# Patient Record
Sex: Female | Born: 1965 | Hispanic: Yes | Smoking: Never smoker
Health system: Southern US, Community
[De-identification: ages and names within clinical notes are randomized; demographics above are authoritative.]

## PROBLEM LIST (undated history)

## (undated) DIAGNOSIS — E041 Nontoxic single thyroid nodule: Secondary | ICD-10-CM

## (undated) HISTORY — PX: UPPER GASTROINTESTINAL ENDOSCOPY: SHX188

## (undated) HISTORY — DX: Nontoxic single thyroid nodule: E04.1

---

## 1992-08-13 HISTORY — PX: OVARIAN CYST REMOVAL: SHX89

## 2006-06-04 ENCOUNTER — Other Ambulatory Visit: Admission: RE | Admit: 2006-06-04 | Discharge: 2006-06-04 | Payer: Self-pay | Admitting: Gynecology

## 2007-03-24 ENCOUNTER — Encounter: Admission: RE | Admit: 2007-03-24 | Discharge: 2007-03-24 | Payer: Self-pay | Admitting: Gynecology

## 2007-04-07 ENCOUNTER — Encounter: Admission: RE | Admit: 2007-04-07 | Discharge: 2007-04-07 | Payer: Self-pay | Admitting: Gynecology

## 2007-06-23 ENCOUNTER — Other Ambulatory Visit: Admission: RE | Admit: 2007-06-23 | Discharge: 2007-06-23 | Payer: Self-pay | Admitting: Gynecology

## 2007-07-22 ENCOUNTER — Encounter (INDEPENDENT_AMBULATORY_CARE_PROVIDER_SITE_OTHER): Payer: Self-pay | Admitting: Interventional Radiology

## 2007-07-22 ENCOUNTER — Encounter: Admission: RE | Admit: 2007-07-22 | Discharge: 2007-07-22 | Payer: Self-pay | Admitting: Endocrinology

## 2007-07-22 ENCOUNTER — Other Ambulatory Visit: Admission: RE | Admit: 2007-07-22 | Discharge: 2007-07-22 | Payer: Self-pay | Admitting: Interventional Radiology

## 2009-01-20 ENCOUNTER — Encounter: Payer: Self-pay | Admitting: Gynecology

## 2009-01-20 ENCOUNTER — Other Ambulatory Visit: Admission: RE | Admit: 2009-01-20 | Discharge: 2009-01-20 | Payer: Self-pay | Admitting: Gynecology

## 2009-01-20 ENCOUNTER — Ambulatory Visit: Payer: Self-pay | Admitting: Gynecology

## 2009-04-12 ENCOUNTER — Ambulatory Visit: Payer: Self-pay | Admitting: Gynecology

## 2009-07-08 IMAGING — US US SOFT TISSUE HEAD/NECK
1 series · 13 of 25 positions shown · non-contrast
Comparison: NONE

CLINICAL DATA: Right thyroid nodule. 

THYROID ULTRASOUND

[Series 1: us thyroid · 0.06mm/px · 13 of 62 slices shown]
[im 1/62]
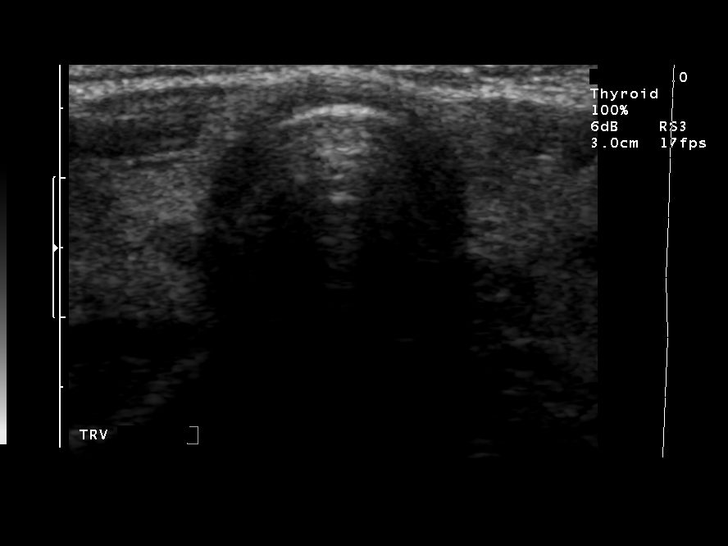
[im 6/62]
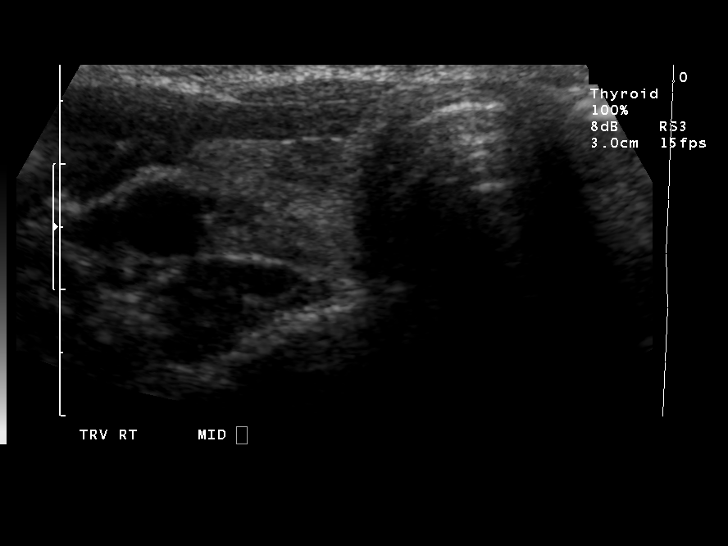
[im 11/62]
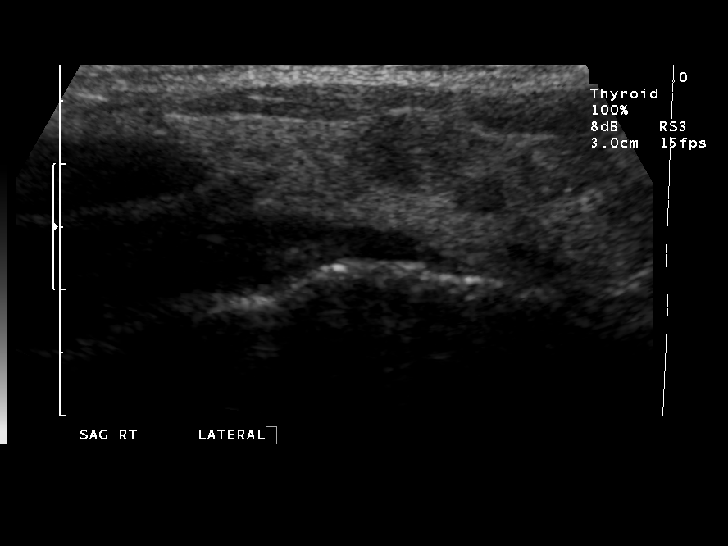
[im 16/62]
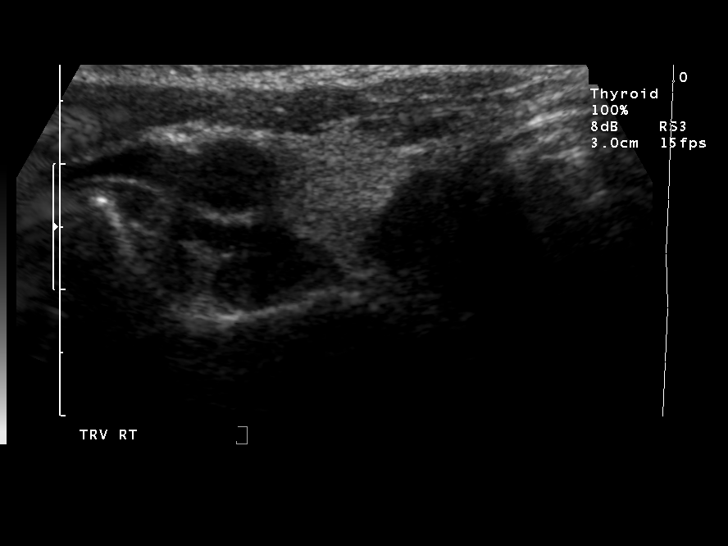
[im 21/62]
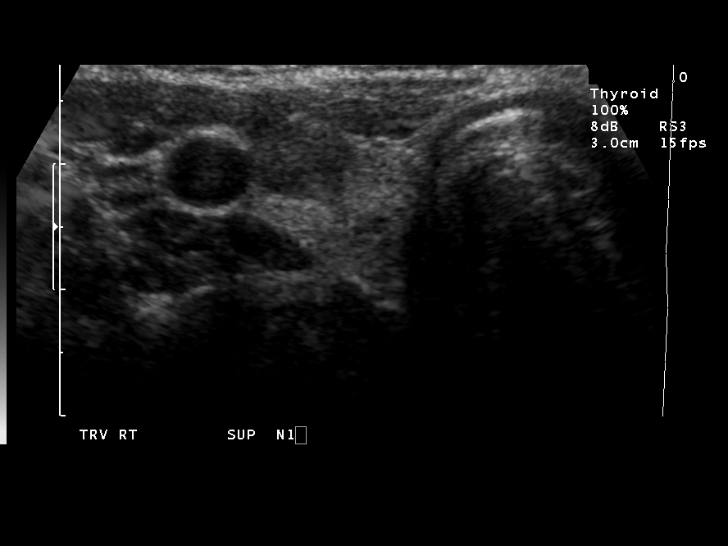
[im 26/62]
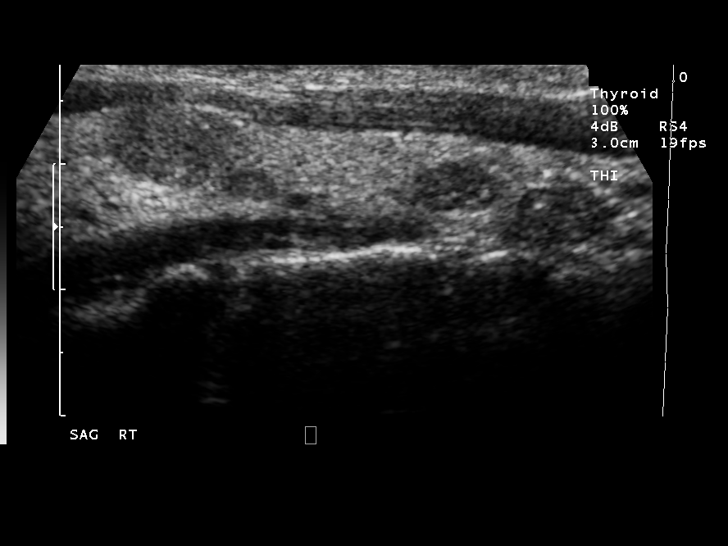
[im 31/62]
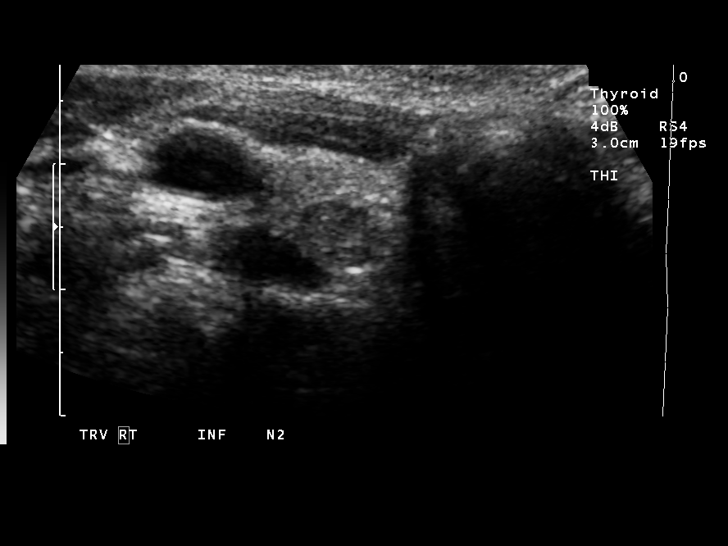
[im 36/62]
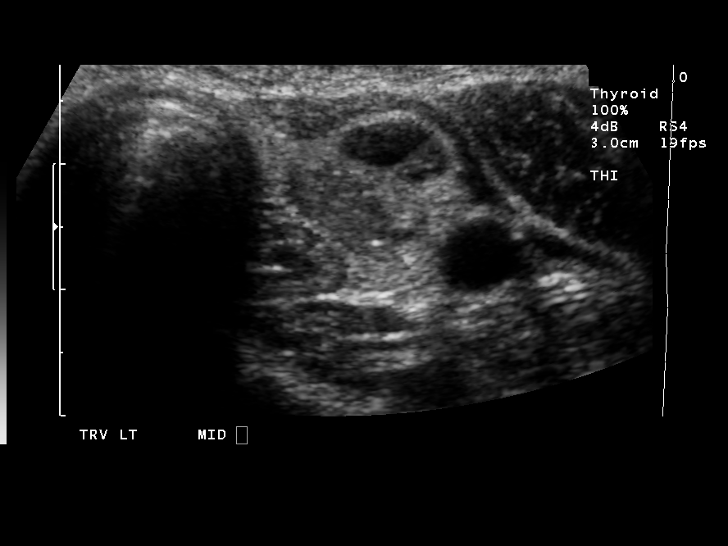
[im 41/62]
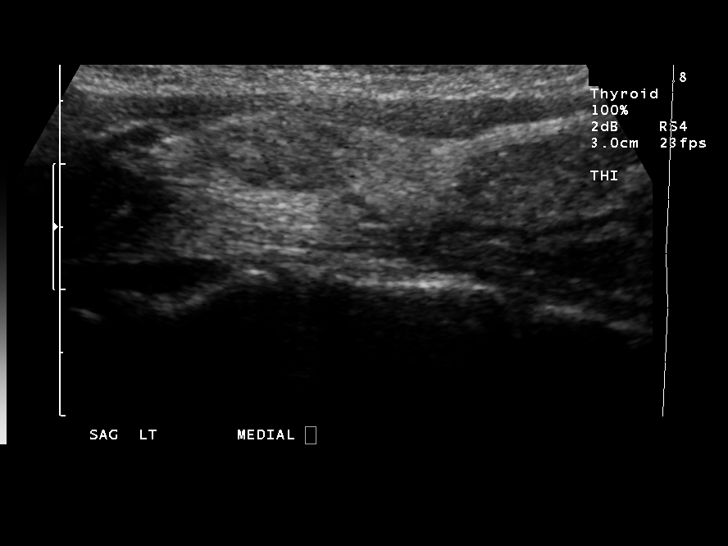
[im 46/62]
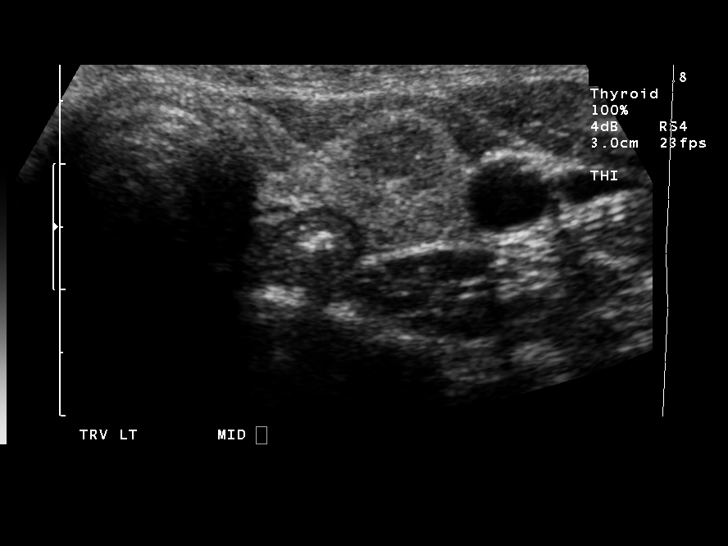
[im 51/62]
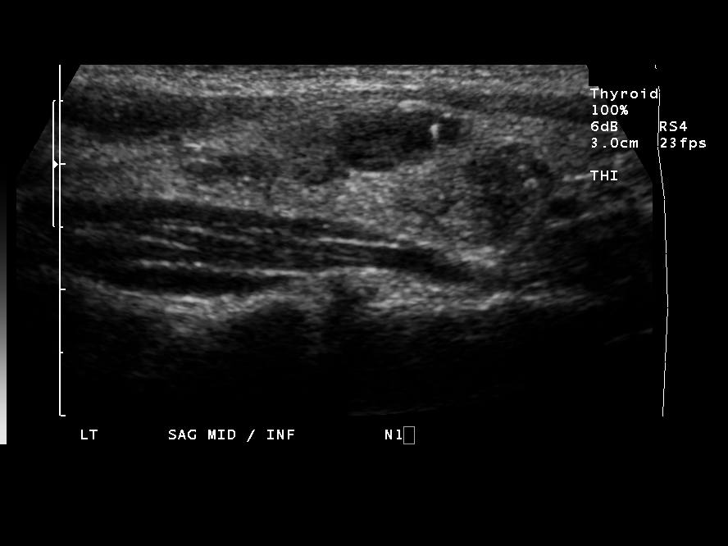
[im 56/62]
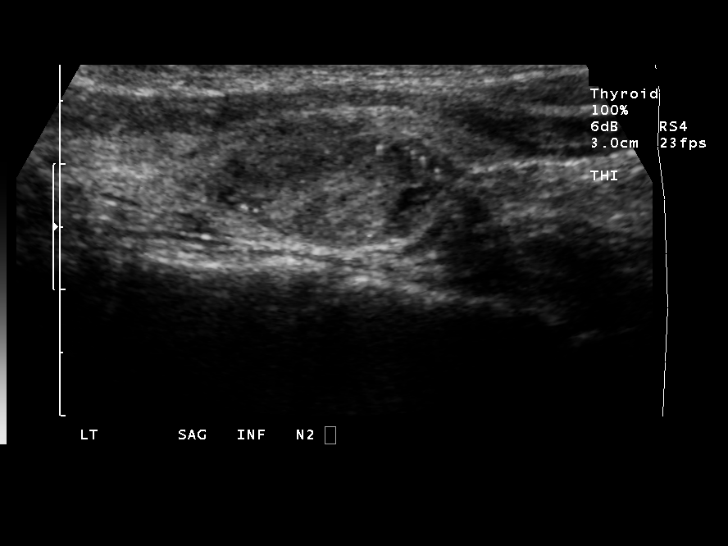
[im 62/62]
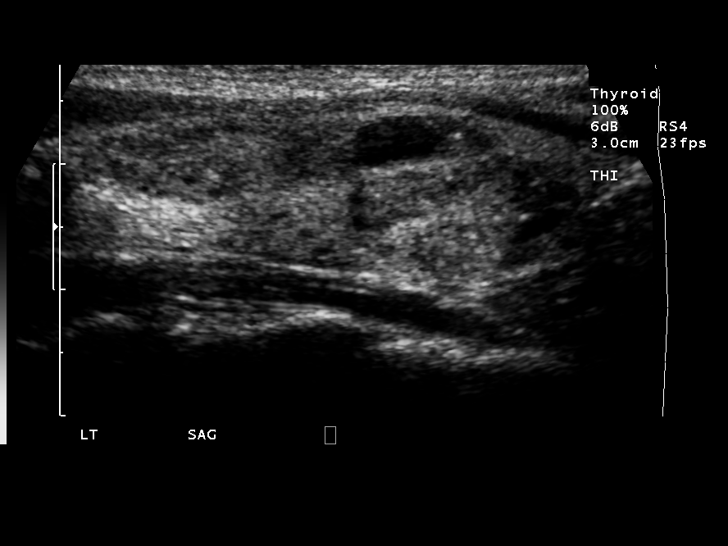

[13 of 25 positions shown; findings below may reference images not displayed]

FINDINGS RIGHT LOBE: 4.4 x 1.4 x 1.5 cm LEFT LOBE: 1.8 cm in the 
transverse plane ISTHMUS: 0.21 cm The thyroid gland appears normal 
for size. The left lobe was inadvertently not measured in the 
sagittal and AP dimensions. Examination of the right lobe 
demonstrates a heterogeneous solid nodule in the upper lobe 
measuring 1.2 x 0.7 x 0.8 cm. A similar appearing heterogeneous 
nodule is noted inferiorly measuring 0.9 x 0.6 x 0.7 cm. There 
appears to be an adjacent similar appearing nodule in the lower 
lobe which was not measured but appears slightly smaller. 
Examination of the left lobe demonstrates multiple nodules 
throughout the lobe. The largest is located inferiorly and is 
heterogeneous and solid measuring 2.1 x 1.0 x 1.1 cm. This 
contains punctate calcifications, which raises the incidence of 
malignancy. A hypoechoic solid nodule is located in the mid lobe 
measuring 1.0 x 0.5 x 0.6 cm. Smaller solid nodules are scattered 
throughout the lobe.
IMPRESSION: Bilateral thyroid nodules consistent with nodular 
thyroid disease. Dominant nodule in the left lobe, measuring
cm, contains punctate calcifications. This raises the risk that 
this may be a malignant nodule. I recommend fine needle aspiration 
biopsy of this nodule. I recommend ultrasound surveillance of the 
remaining nodules with follow-up study in 6 months. Ener Auhsoj Mondongazo 
Joshjax, M.D. electronically reviewed on 06/25/2007 Dict Date: 
06/25/2007  Tran Date: 06/25/2007 CAV  [REDACTED]

## 2009-09-13 ENCOUNTER — Ambulatory Visit: Payer: Self-pay | Admitting: Gynecology

## 2009-09-15 ENCOUNTER — Encounter: Admission: RE | Admit: 2009-09-15 | Discharge: 2009-09-15 | Payer: Self-pay | Admitting: Gynecology

## 2009-10-17 ENCOUNTER — Encounter: Admission: RE | Admit: 2009-10-17 | Discharge: 2009-10-17 | Payer: Self-pay | Admitting: Gynecology

## 2010-02-08 ENCOUNTER — Ambulatory Visit: Payer: Self-pay | Admitting: Gynecology

## 2010-02-08 ENCOUNTER — Other Ambulatory Visit
Admission: RE | Admit: 2010-02-08 | Discharge: 2010-02-08 | Payer: Self-pay | Source: Home / Self Care | Admitting: Gynecology

## 2010-09-03 ENCOUNTER — Encounter: Payer: Self-pay | Admitting: Gynecology

## 2011-02-15 ENCOUNTER — Encounter: Payer: Self-pay | Admitting: *Deleted

## 2013-06-19 ENCOUNTER — Ambulatory Visit: Payer: Self-pay

## 2013-06-19 VITALS — BP 138/78 | HR 56 | Resp 12 | Ht 61.0 in | Wt 125.0 lb

## 2013-06-19 DIAGNOSIS — B351 Tinea unguium: Secondary | ICD-10-CM

## 2013-06-19 DIAGNOSIS — M2010 Hallux valgus (acquired), unspecified foot: Secondary | ICD-10-CM

## 2013-06-19 NOTE — Patient Instructions (Signed)
Onychomycosis/Fungal Toenails  WHAT IS IT? An infection that lies within the keratin of your nail plate that is caused by a fungus.  WHY ME? Fungal infections affect all ages, sexes, races, and creeds.  There may be many factors that predispose you to a fungal infection such as age, coexisting medical conditions such as diabetes, or an autoimmune disease; stress, medications, fatigue, genetics, etc.  Bottom line: fungus thrives in a warm, moist environment and your shoes offer such a location.  IS IT CONTAGIOUS? Theoretically, yes.  You do not want to share shoes, nail clippers or files with someone who has fungal toenails.  Walking around barefoot in the same room or sleeping in the same bed is unlikely to transfer the organism.  It is important to realize, however, that fungus can spread easily from one nail to the next on the same foot.  HOW DO WE TREAT THIS?  There are several ways to treat this condition.  Treatment may depend on many factors such as age, medications, pregnancy, liver and kidney conditions, etc.  It is best to ask your doctor which options are available to you.  1. No treatment.   Unlike many other medical concerns, you can live with this condition.  However for many people this can be a painful condition and may lead to ingrown toenails or a bacterial infection.  It is recommended that you keep the nails cut short to help reduce the amount of fungal nail. 2. Topical treatment.  These range from herbal remedies to prescription strength nail lacquers.  About 40-50% effective, topicals require twice daily application for approximately 9 to 12 months or until an entirely new nail has grown out.  The most effective topicals are medical grade medications available through physicians offices. 3. Oral antifungal medications.  With an 80-90% cure rate, the most common oral medication requires 3 to 4 months of therapy and stays in your system for a year as the new nail grows out.  Oral  antifungal medications do require blood work to make sure it is a safe drug for you.  A liver function panel will be performed prior to starting the medication and after the first month of treatment.  It is important to have the blood work performed to avoid any harmful side effects.  In general, this medication safe but blood work is required. 4. Laser Therapy.  This treatment is performed by applying a specialized laser to the affected nail plate.  This therapy is noninvasive, fast, and non-painful.  It is not covered by insurance and is therefore, out of pocket.  The results have been very good with a 80-95% cure rate.  The [facility] is the only practice in the area to offer this therapy. 5. Permanent Nail Avulsion.  Removing the entire nail so that a new nail will not grow back.  Instructions for use of topical formula 3: Applying formula 3 to the affected nails twice daily every morning every evening as instructed to the top and leading edge of the nail. Reapplied after bathing or showering daily. Maintain applications for minimum of 6 months up to 12 months. As an adjunct to treatment once or twice a month may soak toes in vinegar and warm water for 10-15 minutes to help reduce the fungus in nails.

## 2013-06-19 NOTE — Progress Notes (Signed)
  Subjective:    Patient ID: Cathy Kim, female    DOB: [birth year], 47 y.o.   MRN: [id number]  HPI Comments: ''B/L BIG TOENAILS HAVE FUNGUS FOR 1 MONTH.I THINK THEY GETTING WORSE AND HAVE NOT ANYTHING OVER TH COUNTER''. ALSO, HAVE THIS RASH BETWEEN MY TOES''.  patient also has some questions about bunions may be interested in surgical correction some point in the next year. Is given some information about general cost likely 1200-1500 $. or surgical fee as well as a possible $2500 to 3000 anesthesia and surgical center fees.   Review of Systems  Musculoskeletal: Positive for gait problem.  All other systems reviewed and are negative.       Objective:   Physical Exam Neurovascular status is intact pedal pulses palpable epicritic and proprioceptive sensations intact and unremarkable orthopedic exam reveals mild bunion deformity bilateral no x-rays taken at this time. Dermatologically skin color pigment normal, nails have normal structure except hallux nails distal one third showing yellow discoloration and brittle changes with friability in distal portion nails. They're not painful no secondary infection no separation of the nailbed. No other complaints no other abnormalities remaining digits unremarkable relatively rectus normal feet.    Assessment & Plan:  Assessments hallux abductovalgus deformity likely addressed at in the coming year 2015. Onychomycosis bilateral hallux nails involved the distal one third, recommended topical antifungal agents formula 3, applied twice daily to the affected nails for a six-month up to 12 months duration as instructed Cathy Kim instructions given suggested a possible 6 month followup for reevaluation  Alvan Dame DPM

## 2013-07-03 ENCOUNTER — Ambulatory Visit: Payer: Self-pay

## 2015-07-14 ENCOUNTER — Ambulatory Visit: Payer: Self-pay | Admitting: Gynecology

## 2015-11-18 ENCOUNTER — Other Ambulatory Visit: Payer: Self-pay

## 2015-11-18 DIAGNOSIS — Z1231 Encounter for screening mammogram for malignant neoplasm of breast: Secondary | ICD-10-CM

## 2015-12-02 ENCOUNTER — Ambulatory Visit
Admission: RE | Admit: 2015-12-02 | Discharge: 2015-12-02 | Disposition: A | Discharge status: Home / Self Care | Payer: Managed Care, Other (non HMO) | Source: Ambulatory Visit

## 2015-12-02 DIAGNOSIS — Z1231 Encounter for screening mammogram for malignant neoplasm of breast: Secondary | ICD-10-CM

## 2016-01-02 ENCOUNTER — Ambulatory Visit (INDEPENDENT_AMBULATORY_CARE_PROVIDER_SITE_OTHER): Payer: Managed Care, Other (non HMO) | Admitting: Gynecology

## 2016-01-02 ENCOUNTER — Encounter: Payer: Self-pay | Admitting: Gynecology

## 2016-01-02 VITALS — BP 130/82 | Ht 59.25 in | Wt 134.0 lb

## 2016-01-02 DIAGNOSIS — Z8639 Personal history of other endocrine, nutritional and metabolic disease: Secondary | ICD-10-CM | POA: Insufficient documentation

## 2016-01-02 DIAGNOSIS — N951 Menopausal and female climacteric states: Secondary | ICD-10-CM

## 2016-01-02 DIAGNOSIS — N915 Oligomenorrhea, unspecified: Secondary | ICD-10-CM | POA: Insufficient documentation

## 2016-01-02 DIAGNOSIS — Z01419 Encounter for gynecological examination (general) (routine) without abnormal findings: Secondary | ICD-10-CM

## 2016-01-02 NOTE — Patient Instructions (Signed)
Perimenopausia  (Perimenopause)  La perimenopausia es el momento en que su cuerpo comienza a pasar a la menopausia (sin menstruación durante 12 meses consecutivos). Es un proceso natural. La perimenopausia puede comenzar entre 2 y 8 años antes de la menopausia y por lo general tiene una duración de 1 año más pasada la menopausia. Durante este tiempo, los ovarios podrían producir un óvulo o no. Los ovarios varían su producción de las hormonas estrógeno y progesterona cada mes. Esto puede causar períodos menstruales irregulares, dificultad para quedar embarazada, hemorragia vaginal entre períodos y síntomas incómodos.  CAUSAS  · Producción irregular de las hormonas ováricas estrógeno y progesterona, y no ovular todos los meses.  · Otras causas son:    Tumor de la glándula pituitaria.    Enfermedades que afectan los ovarios.    Radioterapia.    Quimioterapia.    Causas desconocidas.    Fumar mucho y abusar del consumo de alcohol puede llevar a que la perimenopausia aparezca antes.  SIGNOS Y SÍNTOMAS   · Acaloramiento.  · Sudoración nocturna.  · Períodos menstruales irregulares.  · Disminución del deseo sexual.  · Sequedad vaginal.  · Dolores de cabeza.  · Cambios en el estado de ánimo.  · Depresión.  · Problemas de memoria.  · Irritabilidad.  · Cansancio.  · Aumento de peso.  · Problemas para quedar embarazada.  · Pérdida de células óseas (osteoporosis).  · Comienzo de endurecimiento de las arterias (aterosclerosis).  DIAGNÓSTICO   El médico realizará un diagnóstico en función de su edad, historial de períodos menstruales y síntomas. Le realizarán un examen físico para ver si hay algún cambio en su cuerpo, en especial en sus órganos reproductores. Las pruebas hormonales pueden ser o no útiles según la cantidad de hormonas femeninas que produzca y cuándo las produzca. Sin embargo, podrán realizarse otras pruebas hormonales para detectar otros problemas.  TRATAMIENTO   En algunos casos, no se necesita tratamiento. La  decisión acerca de qué tratamiento es necesario durante la perimenopausia deberá realizarse en conjunto con su médico según cómo estén afectando los síntomas a su estilo de vida. Existen varios tratamientos disponibles, como:  · Tratar cada síntoma individual con medicamentos específicos para ese síntoma.  · Algunos medicamentos herbales pueden ayudar en síntomas específicos.  · Psicoterapia.  · Terapia grupal.  INSTRUCCIONES PARA EL CUIDADO EN EL HOGAR   · Controle sus periodos menstruales (cuándo ocurren, qué tan abundantes son, cuánto tiempo pasa entre períodos, y cuánto duran) como también sus síntomas y cuándo comenzaron.  · Tome sólo medicamentos de venta libre o recetados, según las indicaciones del médico.  · Duerma y descanse.  · Haga actividad física.  · Consuma una dieta que contenga calcio (bueno para los huesos) y productos derivados de la soja (actúan como estrógenos).  · No fume.  · Evite las bebidas alcohólicas.  · Tome los suplementos vitamínicos según las indicaciones del médico. En ciertos casos, puede ser de ayuda tomar vitamina E.  · Tome suplementos de calcio y vitamina D para ayudar a prevenir la pérdida ósea.  · En algunos casos la terapia de grupo podrá ayudarla.  · La acupuntura puede ser de ayuda en ciertos casos.  SOLICITE ATENCIÓN MÉDICA SI:   · Tiene preguntas acerca de sus síntomas.  · Necesita ser derivada a un especialista (ginecólogo, psiquiatra, o psicólogo).  SOLICITE ATENCIÓN MÉDICA DE INMEDIATO SI:   · Sufre una hemorragia vaginal abundante.  · Su período menstrual dura más de 8 días.  ·   Sus períodos son recurrentes cada menos de 21 días.  · Tiene hemorragias durante las relaciones sexuales.  · Está muy deprimido.  · Siente dolor al orinar.  · Siente dolor de cabeza intenso.  · Tiene problemas de visión.     Esta información no tiene como fin reemplazar el consejo del médico. Asegúrese de hacerle al médico cualquier pregunta que tenga.     Document Released: 07/30/2005 Document  Revised: 05/20/2013  Elsevier Interactive Patient Education ©2016 [employer]

## 2016-01-02 NOTE — Progress Notes (Signed)
Cathy Kim 08-23-1965 [id number]   History:    50 y.o.  for annual gyn exam  Who has not been seen in the office since 2011. Patient has not had a Pap smear in over 5 years [place]. Patient denies any past history of any abnormal Pap smears. Review of her record indicated back in 2008 she had been referred to the reproductive endocrinologist as a result of a left thyroid nodule for which was aspirated and the cytology was benign. Patient's had no recurrence. She states that she will go 3-4 months without a menstrual cycle. She denies any nipple discharge, no unusual headaches and no visual disturbances. Her husband has had a vasectomy. Her weight has been 134 pounds same as in 2011. She is not fasting today for her blood work. She had a mammogram this year which was normal.  Past medical history,surgical history, family history and social history were all reviewed and documented in the EPIC chart.  Gynecologic History Patient's last menstrual period was 12/19/2015. Contraception: vasectomy Last Pap:  Over 5 years ago. Results were: normal Last mammogram:  2017. Results were: normal  Obstetric History OB History  Gravida Para Term Preterm AB SAB TAB Ectopic Multiple Living  4 3   1 1    3     # Outcome Date GA Lbr Len/2nd Weight Sex Delivery Anes PTL Lv  4 SAB           3 Para           2 Para           1 Para                ROS: A ROS was performed and pertinent positives and negatives are included in the history.  GENERAL: No fevers or chills. HEENT: No change in vision, no earache, sore throat or sinus congestion. NECK: No pain or stiffness. CARDIOVASCULAR: No chest pain or pressure. No palpitations. PULMONARY: No shortness of breath, cough or wheeze. GASTROINTESTINAL: No abdominal pain, nausea, vomiting or diarrhea, melena or bright red blood per rectum. GENITOURINARY: No urinary frequency, urgency, hesitancy or dysuria. MUSCULOSKELETAL: No joint or muscle pain,  no back pain, no recent trauma. DERMATOLOGIC: No rash, no itching, no lesions. ENDOCRINE: No polyuria, polydipsia, no heat or cold intolerance. No recent change in weight. HEMATOLOGICAL: No anemia or easy bruising or bleeding. NEUROLOGIC: No headache, seizures, numbness, tingling or weakness. PSYCHIATRIC: No depression, no loss of interest in normal activity or change in sleep pattern.     Exam: chaperone present  BP 130/82 mmHg  Ht 4' 11.25" (1.505 m)  Wt 134 lb (60.782 kg)  BMI 26.84 kg/m2  LMP 12/19/2015  Body mass index is 26.84 kg/(m^2).  General appearance : Well developed well nourished female. No acute distress HEENT: Eyes: no retinal hemorrhage or exudates,  Neck supple, trachea midline, no carotid bruits, no thyroidmegaly Lungs: Clear to auscultation, no rhonchi or wheezes, or rib retractions  Heart: Regular rate and rhythm, no murmurs or gallops Breast:Examined in sitting and supine position were symmetrical in appearance, no palpable masses or tenderness,  no skin retraction, no nipple inversion, no nipple discharge, no skin discoloration, no axillary or supraclavicular lymphadenopathy Abdomen: no palpable masses or tenderness, no rebound or guarding Extremities: no edema or skin discoloration or tenderness  Pelvic:  Bartholin, Urethra, Skene Glands: Within normal limits             Vagina: No gross  lesions or discharge  Cervix: No gross lesions or discharge  Uterus   retroverted, normal size, shape and consistency, non-tender and mobile  Adnexa  Without masses or tenderness  Anus and perineum  normal   Rectovaginal  normal sphincter tone without palpated masses or tenderness             Hemoccult  Not indicated     Assessment/Plan:  50 y.o. female for annual exam  With secondary amenorrhea minimal if any vasomotor symptoms. The following labs will be ordered: FSH, prolactin, thyroid panel, CBC, comprehensive metabolic panel, fasting lipid profile, TSH, and urinalysis.  I'm point year her prescription for Provera 10 mg to take 1 by mouth daily for 10 days of each month if she does not have a spontaneous menses providing that all the above lab tests are normal. She was encouraged to do her monthly breast exam. We discussed importance of calcium vitamin D and regular exercise for osteoporosis prevention. Pap smear with HPV screening was done today. Literature information on the perimenopause was provided.   Ok Edwards MD, 5:15 PM 01/02/2016

## 2016-01-04 LAB — URINALYSIS W MICROSCOPIC + REFLEX CULTURE
Bacteria, UA: NONE SEEN [HPF]
Bilirubin Urine: NEGATIVE
Casts: NONE SEEN [LPF]
Crystals: NONE SEEN [HPF]
GLUCOSE, UA: NEGATIVE
HGB URINE DIPSTICK: NEGATIVE
KETONES UR: NEGATIVE
LEUKOCYTES UA: NEGATIVE
Nitrite: NEGATIVE
PH: 6 (ref 5.0–8.0)
Protein, ur: NEGATIVE
RBC / HPF: NONE SEEN RBC/HPF (ref <=2)
SPECIFIC GRAVITY, URINE: 1.025 (ref 1.001–1.035)
Yeast: NONE SEEN [HPF]

## 2016-01-05 LAB — URINE CULTURE
COLONY COUNT: NO GROWTH
Organism ID, Bacteria: NO GROWTH

## 2016-01-06 LAB — PAP, TP IMAGING W/ HPV RNA, RFLX HPV TYPE 16,18/45: HPV mRNA, High Risk: NOT DETECTED

## 2016-04-03 ENCOUNTER — Other Ambulatory Visit: Payer: Managed Care, Other (non HMO)

## 2016-04-06 ENCOUNTER — Other Ambulatory Visit: Payer: Managed Care, Other (non HMO)

## 2016-04-06 ENCOUNTER — Other Ambulatory Visit: Payer: Self-pay | Admitting: Anesthesiology

## 2016-04-06 DIAGNOSIS — N915 Oligomenorrhea, unspecified: Secondary | ICD-10-CM

## 2016-04-06 DIAGNOSIS — Z8639 Personal history of other endocrine, nutritional and metabolic disease: Secondary | ICD-10-CM

## 2016-04-06 DIAGNOSIS — Z01419 Encounter for gynecological examination (general) (routine) without abnormal findings: Secondary | ICD-10-CM

## 2016-04-06 LAB — COMPREHENSIVE METABOLIC PANEL
ALK PHOS: 74 U/L (ref 33–115)
ALT: 12 U/L (ref 6–29)
AST: 16 U/L (ref 10–35)
Albumin: 4.1 g/dL (ref 3.6–5.1)
BILIRUBIN TOTAL: 0.4 mg/dL (ref 0.2–1.2)
BUN: 14 mg/dL (ref 7–25)
CO2: 25 mmol/L (ref 20–31)
Calcium: 9.5 mg/dL (ref 8.6–10.2)
Chloride: 104 mmol/L (ref 98–110)
Creat: 0.68 mg/dL (ref 0.50–1.10)
Glucose, Bld: 84 mg/dL (ref 65–99)
POTASSIUM: 3.7 mmol/L (ref 3.5–5.3)
Sodium: 140 mmol/L (ref 135–146)
TOTAL PROTEIN: 6.7 g/dL (ref 6.1–8.1)

## 2016-04-06 LAB — LIPID PANEL
CHOL/HDL RATIO: 2.7 ratio (ref <=5.0)
CHOLESTEROL: 155 mg/dL (ref 125–200)
HDL: 57 mg/dL (ref >=46)
LDL Cholesterol: 78 mg/dL (ref <130)
Triglycerides: 101 mg/dL (ref <150)
VLDL: 20 mg/dL (ref <30)

## 2016-04-07 LAB — TSH: TSH: 0.23 mIU/L — ABNORMAL LOW

## 2016-04-09 ENCOUNTER — Other Ambulatory Visit: Payer: Self-pay | Admitting: Gynecology

## 2016-04-09 DIAGNOSIS — R7989 Other specified abnormal findings of blood chemistry: Secondary | ICD-10-CM

## 2016-04-10 ENCOUNTER — Other Ambulatory Visit: Payer: Self-pay

## 2016-05-08 ENCOUNTER — Other Ambulatory Visit: Payer: Managed Care, Other (non HMO)

## 2016-05-08 DIAGNOSIS — R7989 Other specified abnormal findings of blood chemistry: Secondary | ICD-10-CM

## 2016-05-09 LAB — THYROID PANEL WITH TSH
Free Thyroxine Index: 2.4 (ref 1.4–3.8)
T3 Uptake: 28 % (ref 22–35)
T4 TOTAL: 8.4 ug/dL (ref 4.5–12.0)
TSH: 0.13 mIU/L — ABNORMAL LOW

## 2016-05-15 ENCOUNTER — Ambulatory Visit: Payer: Managed Care, Other (non HMO) | Admitting: Gynecology

## 2016-05-18 ENCOUNTER — Ambulatory Visit (INDEPENDENT_AMBULATORY_CARE_PROVIDER_SITE_OTHER): Payer: Managed Care, Other (non HMO) | Admitting: Gynecology

## 2016-05-18 ENCOUNTER — Encounter: Payer: Self-pay | Admitting: Gynecology

## 2016-05-18 VITALS — BP 118/76

## 2016-05-18 DIAGNOSIS — E063 Autoimmune thyroiditis: Secondary | ICD-10-CM | POA: Diagnosis not present

## 2016-05-18 DIAGNOSIS — N912 Amenorrhea, unspecified: Secondary | ICD-10-CM

## 2016-05-18 DIAGNOSIS — R232 Flushing: Secondary | ICD-10-CM

## 2016-05-18 LAB — TSH: TSH: 0.18 mIU/L — ABNORMAL LOW

## 2016-05-18 LAB — PREGNANCY, URINE: PREG TEST UR: NEGATIVE

## 2016-05-18 MED ORDER — MEDROXYPROGESTERONE ACETATE 10 MG PO TABS: ORAL_TABLET | ORAL | 4 refills | Status: DC

## 2016-05-18 NOTE — Patient Instructions (Addendum)
Terapia de reemplazo hormonal (Hormone Therapy) En la menopausia, su cuerpo comienza a producir menos estrgeno y progesterona. Esto provoca que el cuerpo deje de tener perodos menstruales. Esto se debe a que el estrgeno y la progesterona controlan sus perodos y su ciclo menstrual. Una falta de estrgeno puede causar sntomas tales como:  Golpes calor.  Sequedad vaginal  Piel seca.  Prdida del deseo sexual.  Riesgo de prdida de hueso (osteoporosis). Cuando esto ocurre, puede elegir realizar una terapia hormonal para volver a obtener el estrgeno perdido durante la menopausia. Cuando slo se introduce esta hormona, el procedimiento se conoce normalmente como TRE (terapia de reemplazo de estrgeno). Cuando la hormona progestina se combina con el estrgeno, el procedimiento se conoce normalmente como TH (terapia hormonal). Esto es lo que previamente se conoca como terapia de reemplazo hormonal (TRH). El profesional que le asiste le ayudar a tomar una decisin acerca de lo que resulte lo mejor para usted. La decisin de realizar una TRH cambia a menudo debido a que se realizan nuevos exmenes. Muchos estudios no ponen de acuerdo con respecto a los beneficios de realizar una terapia de reemplazo hormonal.  BENEFICIOS PROBABLES DE LA TRH QUE INCLUYEN PROTECCIN CONTRA:  Golpes de calor - Un golpe de calor es la sensacin repentina de calor sobre la cara y el cuerpo. La piel enrojece, como al sonrojarse. Estn asociados con la transpiracin y los trastornos del sueo. Las mujeres que atraviesan la menopausia pueden tener golpes de calor unas pocas veces en el mes o varios al da; esto depende de la mujer.  Osteoporosis (prdida de hueso) - El estrgeno ayuda a protegerse contra la prdida de hueso. Luego de la menopausia, los huesos de una mujer pierden calcio y se vuelven frgiles y quebradizos. Como resultado, es ms probable que el hueso se quiebre. Los que resultan afectados con mayor  frecuencia son los de la cadera, la mueca y la columna vertebral. La terapia hormonal puede ayudar a retardar la prdida de hueso luego de la menopausia. Realizar ejercicios con peso y tomar calcio con vitamina D tambin puede ayudar a prevenir la prdida de hueso. Existen medicamentos que puede prescribir el profesional que la asiste para ayudar a prevenir la osteoporosis.  Sequedad vaginal - La prdida de estrgeno produce cambios en la vagina. El recubrimiento de la misma puede volverse fino y reseco. Estos cambios pueden causar dolor y sangrado durante las relaciones sexuales. La sequedad tambin puede producir una infeccin. Puede ocasionarle ardor y picazn.  Las infecciones en las vas urinarias son ms comunes luego de la menopausia debido a la falta de estrgeno.  Otros beneficios posibles del estrgeno incluyen un cambio positivo en el humor y en la memoria de corto plazo en las mujeres. EFECTOS SECUNDARIOS Y RIESGOS  Utilizar estrgeno slo sin progesterona causa que el recubrimiento del tero crezca. Esto aumenta el riesgo de cncer endometrial. El profesional que la asiste deber darle otra hormona llamada progestina, si usted tiene tero.  Las mujeres que realizan una TH combinada (estrgeno y progestina) parecen tener un mayor riesgo de sufrir cncer de mama. El riesgo parece ser pequeo, pero aumenta a lo largo del tiempo que se realice la TH.  La terapia combinada tambin hace que el tejido mamario sea levemente ms denso, lo que hace que sea ms difcil leer mamografas (radiografas de mama).  Combinada, la terapia de estrgeno y progesterona puede realizarse todos los das, en cuyo caso podrn aparecer manchas de sangre. La TH puede realizarse de   manera cclica, en cuyo caso tendr perodos menstruales.  La TH puede aumentar el riesgo de infarto, ataque cardaco, cncer de mama y formacin de cogulos en la pierna.  El estrgeno transdrmico (estrgeno que se absorbe a travs  de la piel mediante el uso de un parche o una crema) puede tener mejores resultados en los siguientes casos:  Colesterol.  La presin arterial.  Cogulos sanguneos. La presencia de estas afecciones puede indicar que no debe realizarse terapia hormonal:  Cncer de endometrio.  Enfermedad heptica.  Cncer de mama.  Cardiopata.  Antecedentes de cogulos sanguneos.  Ictus. TRATAMIENTO  Si decide realizar una TH y tiene tero, normalmente se prescribe el uso de estrgeno y progestina.  El profesional que la asiste le ayudar a decidir la mejor forma de tomar los medicamentos.  Entre las formas posibles de administracin de estrgeno, se incluyen las siguientes:  Pldoras.  Parches.  Geles.  Aerosoles.  Crema, anillos y vulos vaginales con estrgeno.  Lo mejor es tomar la menor dosis posible que pueda ayudarla con sus sntomas y tomarlos durante la menor cantidad de tiempo posible.  La terapia hormonal puede ayudar a aliviar algunos de los problemas (sntomas) que afectan a las mujeres durante la menopausia. Antes de tomar una decisin con respecto a la TH, converse con el profesional que la asiste acerca de qu es lo mejor para usted. Mantngase bien informada y sintase cmoda con sus decisiones. INSTRUCCIONES PARA EL CUIDADO DOMICILIARIO:  Siga las indicaciones del profesional con respecto a cmo tomar los medicamentos.  Se realiza una prueba de Papanicolaou para detectar el cncer de cuello del tero.  El primer Papanicolaou debe realizarse a los 21 aos.  La prueba se repite cada 2 aos entre los 21 y los 29 aos.  Despus de los 30 aos, debe realizarse una prueba de Papanicolaou cada 3 aos siempre que los 3 estudios anteriores hayan sido normales.  Algunas mujeres presentan problemas mdicos que aumentan la probabilidad de contraer cncer de cuello del tero. Consulte a su mdico acerca de estos problemas. Es muy importante que le informe a su mdico si  aparecen nuevos problemas poco despus de su ltimo Papanicolaou. En estos casos, el mdico podr indicar que se realice el Papanicolaou con ms frecuencia.  Las recomendaciones anteriores son las mismas para las mujeres que hayan recibido o no la vacuna para el VPH (virus del papiloma humano).  Si le han realizado una histerectoma por un problema que no era cncer o una afeccin que pudiera causar cncer, ya no necesitar un Papanicolaou. Sin embargo, aunque ya no necesite hacerse un Papanicolaou, es una buena idea hacerse un examen regularmente para asegurarse de que no haya otros problemas.  Si tiene entre 65 y 70 aos y ha tenido resultados normales en los estudios de Papanicolaou en los ltimos 10 aos, ya no ser necesario realizarlo. Sin embargo, aunque ya no necesite hacerse un Papanicolaou, es una buena idea hacerse un examen regularmente para asegurarse de que no haya otros problemas.  Si ha recibido un tratamiento para el cncer cervical o una enfermedad que podra causar cncer, necesitar realizarse una prueba de Papanicolaou y controles durante al menos 20 aos de concluido el tratamiento.  Si no se ha hecho los estudios con regularidad, debern volver a evaluarse los factores de riesgo (como el tener un nuevo compaero sexual) para determinar si es necesario que se los haga de nuevo.  Es posible que algunas mujeres deban realizarse exmenes de deteccin con mayor frecuencia   si presentan un alto riesgo de padecer cncer de cuello del tero.  Hgase controles de manera regular, e incluya Papanicolau y mamografas. SOLICITE ATENCIN MDICA DE INMEDIATO SI PRESENTA:  Hemorragia vaginal anormal.  Dolor o inflamacin en las piernas, falta de aliento o dolor en el pecho.  Mareos o dolores de cabeza.  Protuberancias o cambios en sus mamas o axilas.  Pronunciacin inarticulada.  Debilidad o adormecimiento en los brazos o las piernas.  Dolor, ardor o sangrado al orinar.  Dolor  abdominal.   Esta informacin no tiene como fin reemplazar el consejo del mdico. Asegrese de hacerle al mdico cualquier pregunta que tenga.   Document Released: 01/16/2008 Document Revised: 12/14/2014 Elsevier Interactive Patient Education 2016 Elsevier Inc[employer] Perimenopausia (Perimenopause) La perimenopausia es el momento en que su cuerpo comienza a pasar a la menopausia (sin menstruacin durante 12 meses consecutivos). Es un proceso natural. La perimenopausia puede comenzar entre 2 y 8 aos antes de la menopausia y por lo general tiene una duracin de 1 ao ms pasada la menopausia. Durante este tiempo, los ovarios podran producir un vulo o no. Los ovarios varan su produccin de las hormonas estrgeno y progesterona cada mes. Esto puede causar perodos menstruales irregulares, dificultad para quedar embarazada, hemorragia vaginal entre perodos y sntomas incmodos. CAUSAS  Produccin irregular de las hormonas ovricas estrgeno y progesterona, y no ovular todos los meses.  Otras causas son:  Tumor de la glndula pituitaria.  Enfermedades que afectan los ovarios.  Radioterapia.  Quimioterapia.  Causas desconocidas.  Fumar mucho y abusar del consumo de alcohol puede llevar a que la perimenopausia aparezca antes. SIGNOS Y SNTOMAS   Acaloramiento.  Sudoracin nocturna.  Perodos menstruales irregulares.  Disminucin del deseo sexual.  Sequedad vaginal.  Dolores de cabeza.  Cambios en el estado de nimo.  Depresin.  Problemas de memoria.  Irritabilidad.  Cansancio.  Aumento de peso.  Problemas para quedar embarazada.  Prdida de clulas seas (osteoporosis).  Comienzo de endurecimiento de las arterias (aterosclerosis). DIAGNSTICO  El mdico realizar un diagnstico en funcin de su edad, historial de perodos menstruales y sntomas. Le realizarn un examen fsico para ver si hay algn cambio en su cuerpo, en especial en sus rganos reproductores. Las  pruebas hormonales pueden ser o no tiles segn la cantidad de hormonas femeninas que produzca y cundo las produzca. Sin embargo, podrn realizarse otras pruebas hormonales para detectar otros problemas. TRATAMIENTO  En algunos casos, no se necesita tratamiento. La decisin acerca de qu tratamiento es necesario durante la perimenopausia deber realizarse en conjunto con su mdico segn cmo estn afectando los sntomas a su estilo de vida. Existen varios tratamientos disponibles, como:  Tratar cada sntoma individual con medicamentos especficos para ese sntoma.  Algunos medicamentos herbales pueden ayudar en sntomas especficos.  Psicoterapia.  Terapia grupal. INSTRUCCIONES PARA EL CUIDADO EN EL HOGAR   Controle sus periodos menstruales (cundo ocurren, qu tan abundantes son, cunto tiempo pasa entre perodos, y cunto duran) como tambin sus sntomas y cundo comenzaron.  Tome slo medicamentos de venta libre o recetados, segn las indicaciones del mdico.  Duerma y descanse.  Haga actividad fsica.  Consuma una dieta que contenga calcio (bueno para los huesos) y productos derivados de la soja (actan como estrgenos).  No fume.  Evite las bebidas alcohlicas.  Tome los suplementos vitamnicos segn las indicaciones del mdico. En ciertos casos, puede ser de ayuda tomar vitamina E.  Tome suplementos de calcio y vitamina D para ayudar a prevenir la prdida sea.    En algunos casos la terapia de grupo podr ayudarla.  La acupuntura puede ser de ayuda en ciertos casos. SOLICITE ATENCIN MDICA SI:   Tiene preguntas acerca de sus sntomas.  Necesita ser derivada a un especialista (gineclogo, psiquiatra, o psiclogo). SOLICITE ATENCIN MDICA DE INMEDIATO SI:   Sufre una hemorragia vaginal abundante.  Su perodo menstrual dura ms de 8 das.  Sus perodos son recurrentes cada menos de 21 das.  Tiene hemorragias durante las relaciones sexuales.  Est muy  deprimido.  Siente dolor al orinar.  Siente dolor de cabeza intenso.  Tiene problemas de visin.   Esta informacin no tiene como fin reemplazar el consejo del mdico. Asegrese de hacerle al mdico cualquier pregunta que tenga.   Document Released: 07/30/2005 Document Revised: 05/20/2013 Elsevier Interactive Patient Education 2016 Elsevier Inc.   

## 2016-05-18 NOTE — Progress Notes (Addendum)
   Patient is a 50 year old that presented to the office today stating that she has not had a menstrual cycle since May. She was seen at that time for her first annual exam in our office on that office visit and she had been prescribed Provera 10 mg 1 by mouth daily to jump start her menstrual cycle. She never started the medication. Patient's husband has had a vasectomy. Patient denies any visual disturbances or headaches. No nipple discharge reported. Urine pregnancy test in the office was negative. Review of her record indicated back in 2008 she had been referred to the reproductive endocrinologist as a result of a left thyroid nodule for which was aspirated and the cytology was benign. Patient's had no recurrence. She states that she will go 3-4 months without a menstrual cycle.  Patient also started to experience hot flashes, irritability, mood swing, decreased libido.  Exam: Gen. appearance well-developed well-nourished female with the above mentioned complaint Abdomen: Soft nontender no rebound or guarding Pelvic: Bartholin urethra Skene was within normal limits Vagina: No lesions or discharge Cervix: No lesions or discharge Uterus: Anteverted normal size shape and consistency Adnexa: No palpable masses or tenderness Rectal exam: Not done  Assessment/plan: Patient with secondary amenorrhea past history of thyroid nodule. Exam today did not demonstrate any thyroid nodule or any enlargement of her thyroid gland. We are going to repeat her TSH today as well as her thyroid peroxidase antibody screen along with a prolactin and FSH. On August 25 her TSH was low at .1423 and Thyroid Panel was done on September 26 all parameters were normal except a TSH was low at 0.13 indicating some form of Hashimoto's thyroiditis. We'll wait for the results of the blood tests. If all normal she will be prescribed Provera 10 mg take 1 by mouth daily for 10 days to bring down her menses and refills to do so every 30  days of no menses. If thyroid function test is still abnormal we may refer to the endocrinologist for further evaluation. If menopausal we'll begin to start hormone replacement therapy for which have provided her literature information Spanish.

## 2016-05-18 NOTE — Addendum Note (Signed)
Addended by: Berna SpareASTILLO, Tzirel A on: 05/18/2016 03:24 PM   Modules accepted: Orders

## 2016-05-19 LAB — THYROID PEROXIDASE ANTIBODY: Thyroperoxidase Ab SerPl-aCnc: <1 IU/mL (ref <9)

## 2016-05-19 LAB — FOLLICLE STIMULATING HORMONE: FSH: 80.9 m[IU]/mL

## 2016-05-19 LAB — PROLACTIN: Prolactin: 10.1 ng/mL

## 2016-05-24 ENCOUNTER — Telehealth: Payer: Self-pay | Admitting: *Deleted

## 2016-05-24 DIAGNOSIS — E063 Autoimmune thyroiditis: Secondary | ICD-10-CM

## 2016-05-24 NOTE — Telephone Encounter (Signed)
Appointment on 06/04/16 @ 11:00 with Dr.Kumar with [place] location. Will route to [place] to relay to patient.

## 2016-05-24 NOTE — Telephone Encounter (Signed)
-----   Message from Keenan BachelorKatherine R Annas, [place] sent at 05/24/2016  9:39 AM EDT ----- Regarding: referral Please inform patient her TSH is still low. I would like to refer her to Dr. Lurene ShadowBallen or Dr.Gherge for this patient with Hashimoto's  Thyroiditis who has had past history of thyroid nodule several years ago.  Please schedule and let Elane FritzBlanca know so she can call her. Thanks!!

## 2016-05-25 NOTE — Telephone Encounter (Signed)
Patient was informed of appointment date and time. Also informed of lab results. All was explained in spanish. Advised patient to call back with any additional questions.

## 2016-06-04 ENCOUNTER — Ambulatory Visit: Payer: Managed Care, Other (non HMO) | Admitting: Endocrinology

## 2016-06-14 ENCOUNTER — Ambulatory Visit: Payer: Managed Care, Other (non HMO) | Admitting: Endocrinology

## 2016-06-15 ENCOUNTER — Telehealth: Payer: Self-pay | Admitting: Endocrinology

## 2016-06-15 ENCOUNTER — Encounter: Payer: Self-pay | Admitting: Endocrinology

## 2016-06-15 NOTE — Telephone Encounter (Signed)
LM for pt to call back to schedule NP appt 06/21/16 appt is not available

## 2016-07-11 ENCOUNTER — Ambulatory Visit (INDEPENDENT_AMBULATORY_CARE_PROVIDER_SITE_OTHER): Payer: Managed Care, Other (non HMO) | Admitting: Endocrinology

## 2016-07-11 ENCOUNTER — Encounter: Payer: Self-pay | Admitting: Endocrinology

## 2016-07-11 VITALS — BP 120/70 | HR 66 | Wt 133.0 lb

## 2016-07-11 DIAGNOSIS — E041 Nontoxic single thyroid nodule: Secondary | ICD-10-CM | POA: Diagnosis not present

## 2016-07-11 DIAGNOSIS — R5383 Other fatigue: Secondary | ICD-10-CM

## 2016-07-11 NOTE — Progress Notes (Signed)
Patient ID: Cathy Kim, female   DOB: Oct 15, 1965, 50 y.o.   MRN: [id number]            Reason for Appointment: Evaluation of thyroid nodule  Referring physician: Dr. Reynaldo MiniumJuan Kim   History of Present Illness:   The patient's thyroid nodule was first discovered in 02/2007 on a routine exam Previously she had been evaluated when her gynecologist was taking her thyroid levels because of complaints of weight gain    The thyroid ultrasound report is not available However she had a needle aspiration done for her left-sided thyroid nodule which was reported as benign goiter She did not come back for follow-up as she had moved out of town  More recently the thyroid levels have been tested again by her gynecologist on a routine basis Because of relatively low TSH level she has had repeat testing done couple of times consistently low TSH However her free thyroxine index was normal in 9/17 Peroxidase antibody was undetectable  Currently the patient says that on 2 occasions she has had a feeling of pressure and tightness in her neck area which is mostly in the upper part of the neck but has not had any symptoms since July. She does not complain of any difficulty swallowing but occasionally may have a little hoarseness    Lab Results  Component Value Date   TSH 0.18 (L) 05/18/2016   TSH 0.13 (L) 05/08/2016   TSH 0.23 (L) 04/06/2016    Cytology report from 07/2007: The specimen contains small to medium sized sheets of banal appearing follicular epithelial cells, some with oncocytic cell changes. In addition, in the background, there is abundantinflammation and scattered colloid.    Medication List    as of 07/11/2016  4:19 PM   You have not been prescribed any medications.     Allergies: No Known Allergies  Past Medical History:  Diagnosis Date  . Thyroid nodule      Past Surgical History:  Procedure Laterality Date  . SALPINGOOPHORECTOMY Left     Family History   Problem Relation Age of Onset  . Diabetes Father   . Osteoporosis Mother   . GER disease Mother   . Prostate cancer Sister     Social History:  reports that she has never smoked. She does not have any smokeless tobacco history on file. She reports that she does not drink alcohol or use drugs.   Review of Systems:  She complains of feeling tired. She said even when she sleeps well she will not feel rested in the morning No complaints of dizziness She has occasional feelings of rapid heartbeat No recent swelling in her feet. No shortness of breath on exertion Has not had any menstrual cycles, if his age in 10/17 showed her to be in menopause, has not been recommended HRT at present  Has not had any significant weight change lately although her weight is relatively high than a couple of years ago  Wt Readings from Last 3 Encounters:  07/11/16 133 lb (60.3 kg)  01/02/16 134 lb (60.8 kg)  06/19/13 125 lb (56.7 kg)           Examination:   BP 120/70   Pulse 66   Wt 133 lb (60.3 kg)   LMP 12/19/2015   SpO2 99%   BMI 26.64 kg/m    General Appearance:  well-looking        Eyes: No abnormal prominence or swelling of the eyes  THYROID: Thyroid nodule is palpable on the left side in the middle of the thyroid area, firm, smooth about 1.5 cm or so in size.  There is no lymphadenopathy in the neck  Heart sounds normal Lungs clear Abdomen shows no hepatosplenomegaly or other mass.    Reflexes at biceps are normal.  Skin: No rash or lesions Extremities: No edema  Assessment/Plan:  Thyroid nodule: She had a benign thyroid nodule in 2008 Clinical exam shows about the same size of thyroid nodule as before Previous ultrasound report is not available for review today  However patient's complaints of nonspecific fatigue are unlikely to be related to her thyroid, may be partly related to her menopausal status She does have a suppressed TSH with normal free T4 index; also in  2008 she had transient mild decrease in TSH also  Discussed that if she is getting subclinical hyperthyroidism will need to evaluate further with free T4 and free T3 levels If these levels are upper normal may consider doing a thyroid scan to rule out a hot nodule Otherwise would not be necessary to image her thyroid given that her previous thyroid nodule was relatively small and benign on cytology with adequate sampling, appears to have similar exam of her left thyroid nodule today  She does need to establish with a PCP for general medical evaluation  [facility] HospitalKUMAR,Lavone Barrientes 07/11/2016   Consultation note sent to the referring physician

## 2016-07-19 ENCOUNTER — Other Ambulatory Visit (INDEPENDENT_AMBULATORY_CARE_PROVIDER_SITE_OTHER): Payer: Managed Care, Other (non HMO)

## 2016-07-19 DIAGNOSIS — E041 Nontoxic single thyroid nodule: Secondary | ICD-10-CM | POA: Diagnosis not present

## 2016-07-19 LAB — T3, FREE: T3, Free: 3.5 pg/mL (ref 2.3–4.2)

## 2016-07-19 LAB — T4, FREE: FREE T4: 0.76 ng/dL (ref 0.60–1.60)

## 2016-07-21 NOTE — Progress Notes (Signed)
Please let patient know that the lab result is normal and no further action needed

## 2016-12-26 ENCOUNTER — Encounter: Payer: Self-pay | Admitting: Gynecology

## 2017-01-03 ENCOUNTER — Other Ambulatory Visit: Payer: Self-pay | Admitting: Endocrinology

## 2017-01-03 DIAGNOSIS — E041 Nontoxic single thyroid nodule: Secondary | ICD-10-CM

## 2017-01-04 ENCOUNTER — Other Ambulatory Visit (INDEPENDENT_AMBULATORY_CARE_PROVIDER_SITE_OTHER): Payer: Commercial Managed Care - PPO

## 2017-01-04 DIAGNOSIS — E041 Nontoxic single thyroid nodule: Secondary | ICD-10-CM | POA: Diagnosis not present

## 2017-01-04 LAB — TSH: TSH: 0.81 u[IU]/mL (ref 0.35–4.50)

## 2017-01-04 LAB — T4, FREE: Free T4: 0.77 ng/dL (ref 0.60–1.60)

## 2017-01-04 LAB — T3, FREE: T3, Free: 3.7 pg/mL (ref 2.3–4.2)

## 2017-01-09 ENCOUNTER — Ambulatory Visit: Payer: Managed Care, Other (non HMO) | Admitting: Endocrinology

## 2017-01-29 ENCOUNTER — Other Ambulatory Visit: Payer: Self-pay | Admitting: Gynecology

## 2017-01-29 DIAGNOSIS — Z1231 Encounter for screening mammogram for malignant neoplasm of breast: Secondary | ICD-10-CM

## 2017-02-18 ENCOUNTER — Ambulatory Visit
Admission: RE | Admit: 2017-02-18 | Discharge: 2017-02-18 | Disposition: A | Discharge status: Home / Self Care | Payer: Commercial Managed Care - PPO | Source: Ambulatory Visit | Attending: Gynecology | Admitting: Gynecology

## 2017-02-18 DIAGNOSIS — Z1231 Encounter for screening mammogram for malignant neoplasm of breast: Secondary | ICD-10-CM

## 2017-02-19 ENCOUNTER — Encounter: Payer: Self-pay | Admitting: Gynecology

## 2017-02-19 ENCOUNTER — Ambulatory Visit (INDEPENDENT_AMBULATORY_CARE_PROVIDER_SITE_OTHER): Payer: Commercial Managed Care - PPO | Admitting: Gynecology

## 2017-02-19 VITALS — BP 122/80 | Ht 59.0 in | Wt 135.0 lb

## 2017-02-19 DIAGNOSIS — Z01419 Encounter for gynecological examination (general) (routine) without abnormal findings: Secondary | ICD-10-CM | POA: Diagnosis not present

## 2017-02-19 DIAGNOSIS — Z780 Asymptomatic menopausal state: Secondary | ICD-10-CM

## 2017-02-19 DIAGNOSIS — Z8262 Family history of osteoporosis: Secondary | ICD-10-CM | POA: Diagnosis not present

## 2017-02-19 NOTE — Progress Notes (Signed)
Cathy BuddsBlanca Kim Jun 10, 1966 [id number]   History:    51 y.o.  for annual gyn exam Patient denies any past history of any abnormal Pap smears. Review of her record indicated back in 2008 she had been referred to the reproductive endocrinologist as a result of a left thyroid nodule for which was aspirated and the cytology was benign.  She has been followed by the endocrinologist Dr. Lucianne MussKumar for which she has an appointment this week. Patient with diagnoses being menopausal last year with an FSH elevated at 80. She denies any vasomotor symptoms.  Past medical history,surgical history, family history and social history were all reviewed and documented in the EPIC chart.  Gynecologic History Patient's last menstrual period was 12/19/2015. Contraception: vasectomy Last Pap:  2017. Results were: normal Last mammogram:  2018. Results were: normal  Obstetric History OB History  Gravida Para Term Preterm AB Living  4 3     1 3   SAB TAB Ectopic Multiple Live Births  1            # Outcome Date GA Lbr Len/2nd Weight Sex Delivery Anes PTL Lv  4 SAB           3 Para           2 Para           1 Para                ROS: A ROS was performed and pertinent positives and negatives are included in the history.  GENERAL: No fevers or chills. HEENT: No change in vision, no earache, sore throat or sinus congestion. NECK: No pain or stiffness. CARDIOVASCULAR: No chest pain or pressure. No palpitations. PULMONARY: No shortness of breath, cough or wheeze. GASTROINTESTINAL: No abdominal pain, nausea, vomiting or diarrhea, melena or bright red blood per rectum. GENITOURINARY: No urinary frequency, urgency, hesitancy or dysuria. MUSCULOSKELETAL: No joint or muscle pain, no back pain, no recent trauma. DERMATOLOGIC: No rash, no itching, no lesions. ENDOCRINE: No polyuria, polydipsia, no heat or cold intolerance. No recent change in weight. HEMATOLOGICAL: No anemia or easy bruising or bleeding. NEUROLOGIC: No  headache, seizures, numbness, tingling or weakness. PSYCHIATRIC: No depression, no loss of interest in normal activity or change in sleep pattern.     Exam: chaperone present  BP 122/80   Ht 4\' 11"  (1.499 m)   Wt 135 lb (61.2 kg)   LMP 12/19/2015   BMI 27.27 kg/m   Body mass index is 27.27 kg/m.  General appearance : Well developed well nourished female. No acute distress HEENT: Eyes: no retinal hemorrhage or exudates,  Neck supple, trachea midline, no carotid bruits, no thyroidmegaly Lungs: Clear to auscultation, no rhonchi or wheezes, or rib retractions  Heart: Regular rate and rhythm, no murmurs or gallops Breast:Examined in sitting and supine position were symmetrical in appearance, no palpable masses or tenderness,  no skin retraction, no nipple inversion, no nipple discharge, no skin discoloration, no axillary or supraclavicular lymphadenopathy Abdomen: no palpable masses or tenderness, no rebound or guarding Extremities: no edema or skin discoloration or tenderness  Pelvic:  Bartholin, Urethra, Skene Glands: Within normal limits             Vagina: No gross lesions or discharge  Cervix: No gross lesions or discharge  Uterus   anteverted, normal size, shape and consistency, non-tender and mobile  Adnexa  Without masses or tenderness  Anus and perineum  normal   Rectovaginal  normal sphincter  tone without palpated masses or tenderness             Hemoccult  Not indicated     Assessment/Plan:  51 y.o. female for annual exam  Who is menopausal with no vasomotor symptoms. We discussed importance of calcium vitamin D and weightbearing exercises for osteoporosis prevention. Patient's mother had history of osteoporosis. I've recommended she have a baseline bone density study at age 37. She will return to the office later this week for the fasting blood work consisting of a comprehensive metabolic panel, fasting lipid profile,  CBC and urinalysis.   Ok Edwards MD, 4:49 PM  02/19/2017

## 2017-02-20 ENCOUNTER — Ambulatory Visit: Payer: Commercial Managed Care - PPO | Admitting: Endocrinology

## 2017-02-20 DIAGNOSIS — Z0289 Encounter for other administrative examinations: Secondary | ICD-10-CM

## 2017-02-22 ENCOUNTER — Ambulatory Visit: Payer: Commercial Managed Care - PPO | Admitting: Endocrinology

## 2017-02-26 ENCOUNTER — Ambulatory Visit: Payer: Commercial Managed Care - PPO | Admitting: Endocrinology

## 2017-02-26 DIAGNOSIS — Z0289 Encounter for other administrative examinations: Secondary | ICD-10-CM

## 2017-05-30 ENCOUNTER — Ambulatory Visit: Payer: Commercial Managed Care - PPO | Admitting: Endocrinology

## 2017-05-31 ENCOUNTER — Ambulatory Visit: Payer: Commercial Managed Care - PPO | Admitting: Endocrinology

## 2017-05-31 DIAGNOSIS — Z0289 Encounter for other administrative examinations: Secondary | ICD-10-CM

## 2017-06-07 ENCOUNTER — Ambulatory Visit (INDEPENDENT_AMBULATORY_CARE_PROVIDER_SITE_OTHER): Payer: Commercial Managed Care - PPO | Admitting: Obstetrics & Gynecology

## 2017-06-07 ENCOUNTER — Encounter: Payer: Self-pay | Admitting: Obstetrics & Gynecology

## 2017-06-07 VITALS — BP 126/78

## 2017-06-07 DIAGNOSIS — R110 Nausea: Secondary | ICD-10-CM | POA: Diagnosis not present

## 2017-06-07 DIAGNOSIS — Z01419 Encounter for gynecological examination (general) (routine) without abnormal findings: Secondary | ICD-10-CM

## 2017-06-07 DIAGNOSIS — N950 Postmenopausal bleeding: Secondary | ICD-10-CM

## 2017-06-07 MED ORDER — FLUCONAZOLE 150 MG PO TABS: 150.0000 mg | ORAL_TABLET | Freq: Once | ORAL | 2 refills | Status: DC

## 2017-06-07 MED ORDER — TINIDAZOLE 500 MG PO TABS: 2.0000 g | ORAL_TABLET | Freq: Every day | ORAL | 0 refills | Status: DC

## 2017-06-07 NOTE — Progress Notes (Signed)
    Cathy Kim 05-04-1966 [id number]        51 y.o.  G4P0013   RP:  PMB x 4 days a month ago  HPI:  Vaginal bleeding x 4 days a month ago.  No bleeding since.  Before that, her LMP was 12/2015.  [place] 80.9 on 05/18/2016.  No pelvic pain.  Normal vaginal secretions.  Mictions/BMs wnl.  Past medical history,surgical history, problem list, medications, allergies, family history and social history were all reviewed and documented in the EPIC chart.  Directed ROS with pertinent positives and negatives documented in the history of present illness/assessment and plan.  Exam:  Vitals:   06/07/17 0926  BP: 126/78   General appearance:  Normal  Gyn exam:  Vulva normal.  Speculum:  Cervix/Vagina normal.  Bimanual exam:  Uterus RV normal volume, mobile, NT.  No adnexal mass.  Assessment/Plan:  51 y.o. G4P0013   1. Postmenopausal bleeding Possible perimenopause, but needs investigation for PMB.  R/O Endometrial pathology.  F/U Pelvic US/possible EBx. - FSH - US Transvaginal Non-OB; Future  2. Nausea F/U with Fam MD. - TSH - Comp Met (CMET)  3. Well female exam with routine gynecological exam Labs to complete annual screening.  F/U Annual/Gyn exam. - CBC - Lipid Profile - Vitamin D 1,25 dihydroxy  Counseling on above issues >50% x 25 minutes  Princess Bruins MD, 9:51 AM 06/07/2017

## 2017-06-08 NOTE — Patient Instructions (Signed)
1. Postmenopausal bleeding Possible perimenopause, but needs investigation for PMB.  R/O Endometrial pathology.  F/U Pelvic US/possible EBx. - FSH - US Transvaginal Non-OB; Future  2. Nausea F/U with Fam MD. - TSH - Comp Met (CMET)  3. Well female exam with routine gynecological exam Labs to complete annual screening.  F/U Annual/Gyn exam. - CBC - Lipid Profile - Vitamin D 1,25 dihydroxy  Cathy Kim, it was a pleasure meeting you today!  I will inform you of your results as soon as available.

## 2017-06-18 LAB — TSH: TSH: 0.37 mIU/L — ABNORMAL LOW

## 2017-06-18 LAB — COMPREHENSIVE METABOLIC PANEL
AG RATIO: 1.6 (calc) (ref 1.0–2.5)
ALKALINE PHOSPHATASE (APISO): 91 U/L (ref 33–115)
ALT: 12 U/L (ref 6–29)
AST: 16 U/L (ref 10–35)
Albumin: 4.4 g/dL (ref 3.6–5.1)
BILIRUBIN TOTAL: 0.8 mg/dL (ref 0.2–1.2)
BUN: 10 mg/dL (ref 7–25)
CALCIUM: 9.6 mg/dL (ref 8.6–10.2)
CHLORIDE: 104 mmol/L (ref 98–110)
CO2: 24 mmol/L (ref 20–32)
Creat: 0.7 mg/dL (ref 0.50–1.10)
GLOBULIN: 2.8 g/dL (ref 1.9–3.7)
Glucose, Bld: 84 mg/dL (ref 65–99)
Potassium: 4.1 mmol/L (ref 3.5–5.3)
Sodium: 138 mmol/L (ref 135–146)
Total Protein: 7.2 g/dL (ref 6.1–8.1)

## 2017-06-18 LAB — T3, FREE: T3 FREE: 3.3 pg/mL (ref 2.3–4.2)

## 2017-06-18 LAB — TEST AUTHORIZATION 2

## 2017-06-18 LAB — THYROID STIMULATING IMMUNOGLOBULIN

## 2017-06-18 LAB — T4, FREE: Free T4: 1 ng/dL (ref 0.8–1.8)

## 2017-06-18 LAB — CBC
HEMATOCRIT: 40.2 % (ref 35.0–45.0)
Hemoglobin: 13.1 g/dL (ref 11.7–15.5)
MCH: 27.5 pg (ref 27.0–33.0)
MCHC: 32.6 g/dL (ref 32.0–36.0)
MCV: 84.5 fL (ref 80.0–100.0)
MPV: 11.1 fL (ref 7.5–12.5)
PLATELETS: 237 10*3/uL (ref 140–400)
RBC: 4.76 10*6/uL (ref 3.80–5.10)
RDW: 16.3 % — AB (ref 11.0–15.0)
WBC: 5.6 10*3/uL (ref 3.8–10.8)

## 2017-06-18 LAB — VITAMIN D 1,25 DIHYDROXY
Vitamin D 1, 25 (OH)2 Total: 64 pg/mL (ref 18–72)
Vitamin D2 1, 25 (OH)2: <8 pg/mL
Vitamin D3 1, 25 (OH)2: 64 pg/mL

## 2017-06-18 LAB — LIPID PANEL
Cholesterol: 179 mg/dL (ref <200)
HDL: 63 mg/dL (ref >50)
LDL Cholesterol (Calc): 99 mg/dL (calc)
NON-HDL CHOLESTEROL (CALC): 116 mg/dL (ref <130)
TRIGLYCERIDES: 80 mg/dL (ref <150)
Total CHOL/HDL Ratio: 2.8 (calc) (ref <5.0)

## 2017-06-18 LAB — TEST AUTHORIZATION

## 2017-06-18 LAB — FOLLICLE STIMULATING HORMONE: FSH: 55 m[IU]/mL

## 2017-06-24 ENCOUNTER — Ambulatory Visit (INDEPENDENT_AMBULATORY_CARE_PROVIDER_SITE_OTHER): Payer: Commercial Managed Care - PPO

## 2017-06-24 ENCOUNTER — Ambulatory Visit (INDEPENDENT_AMBULATORY_CARE_PROVIDER_SITE_OTHER): Payer: Commercial Managed Care - PPO | Admitting: Family Medicine

## 2017-06-24 ENCOUNTER — Other Ambulatory Visit: Payer: Self-pay

## 2017-06-24 ENCOUNTER — Encounter: Payer: Self-pay | Admitting: Family Medicine

## 2017-06-24 VITALS — BP 110/68 | HR 75 | Temp 98.0°F | Resp 18 | Ht 60.32 in | Wt 132.6 lb

## 2017-06-24 DIAGNOSIS — M542 Cervicalgia: Secondary | ICD-10-CM

## 2017-06-24 DIAGNOSIS — M5440 Lumbago with sciatica, unspecified side: Secondary | ICD-10-CM

## 2017-06-24 DIAGNOSIS — G8929 Other chronic pain: Secondary | ICD-10-CM

## 2017-06-24 DIAGNOSIS — K4190 Unilateral femoral hernia, without obstruction or gangrene, not specified as recurrent: Secondary | ICD-10-CM

## 2017-06-24 DIAGNOSIS — Z0000 Encounter for general adult medical examination without abnormal findings: Secondary | ICD-10-CM | POA: Diagnosis not present

## 2017-06-24 NOTE — Patient Instructions (Signed)
     IF you received an x-ray today, you will receive an invoice from [facility]. Please contact [facility] at [phone] with questions or concerns regarding your invoice.   IF you received labwork today, you will receive an invoice from [facility]. Please contact [facility] at [phone] with questions or concerns regarding your invoice.   Our billing staff will not be able to assist you with questions regarding bills from these companies.  You will be contacted with the lab results as soon as they are available. The fastest way to get your results is to activate your My Chart account. Instructions are located on the last page of this paperwork. If you have not heard from us regarding the results in 2 weeks, please contact this office.     

## 2017-06-24 NOTE — Progress Notes (Signed)
11/12/20184:40 PM  Cathy BuddsBlanca Kim 01/21/1966, 51 y.o. female [id number]  Chief Complaint  Patient presents with  . Annual Exam    HPI:   Patient is a 51 y.o. female  who presents today for annual exam establish care.  Gyn exam done with Dr Lebron ConnersLafonte, several weeks ago She reports pap and labs was done, mammogram was ordered [id number] LMP a year and half ago, in menopause H/o Left ovarian cyst, s/p removal  Previous PCP at [facility]  Her main concern today is back and neck pain, chronic, worsening. 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 She denies any radiation of pain down her legs. She does feel numbness and tingling down her right arm. Reports about a year ago while at work a heavy metal step fell on her lower back. She denies any problems with urination or bowel.  She also reports a long standing hernia in right groin area. States that it has been bothering her more over past years.  Depression screen PHQ 2/9 06/24/2017  Decreased Interest 0  Down, Depressed, Hopeless 0  PHQ - 2 Score 0    No Known Allergies  Prior to Admission medications   Not on File    Past Medical History:  Diagnosis Date  . Thyroid nodule    benign FNA per patient    History reviewed. No pertinent surgical history.  Social History   Tobacco Use  . Smoking status: Never Smoker  . Smokeless tobacco: Never Used  Substance Use Topics  . Alcohol use: No    Alcohol/week: 0.0 oz    Family History  Problem Relation Age of Onset  . Diabetes Father   . Osteoporosis Mother   . GER disease Mother   . Prostate cancer Sister   . Thyroid disease Paternal Aunt        Goiter    Review of Systems  Constitutional: Negative for chills and fever.  Eyes: Positive for blurred vision and redness.  Respiratory: Negative for cough and shortness of breath.   Cardiovascular: Negative for chest pain,  palpitations and leg swelling.  Gastrointestinal: Positive for nausea. Negative for abdominal pain and vomiting.  Musculoskeletal: Positive for back pain, joint pain, myalgias and neck pain.  Neurological: Positive for dizziness, tingling, tremors, seizures, weakness and headaches.  Endo/Heme/Allergies:       + heat intolerance  Psychiatric/Behavioral: Positive for memory loss. The patient is nervous/anxious.      OBJECTIVE:  Blood pressure 110/68, pulse 75, temperature 98 F (36.7 C), temperature source Oral, resp. rate 18, height 5' 0.32" (1.532 m), weight 132 lb 9.6 oz (60.1 kg), last menstrual period 12/19/2015, SpO2 99 %.  Physical Exam  Constitutional: She is oriented to person, place, and time and well-developed, well-nourished, and in no distress.  HENT:  Head: Normocephalic and atraumatic.  Right Ear: Hearing, tympanic membrane, external ear and ear canal normal.  Left Ear: Hearing, tympanic membrane, external ear and ear canal normal.  Mouth/Throat: Oropharynx is clear and moist.  Eyes: EOM are normal. Pupils are equal, round, and reactive to light.  Neck: Neck supple. No thyromegaly present.  Cardiovascular: Normal rate, regular rhythm, normal heart sounds and intact distal pulses. Exam reveals no gallop and no friction rub.  No murmur heard. Pulmonary/Chest: Effort normal and breath sounds normal. She has no wheezes. She has no rales.  Abdominal: Soft. Bowel sounds are normal. She exhibits no distension  and no mass. There is no tenderness.  Musculoskeletal: Normal range of motion. She exhibits no edema.  Lymphadenopathy:    She has no cervical adenopathy.  Neurological: She is alert and oriented to person, place, and time. She has normal reflexes. Gait normal.  Skin: Skin is warm and dry.  Psychiatric: Mood and affect normal.  Nursing note and vitals reviewed.     No results found for this or any previous visit (from the past 24 hour(s)).  Dg Cervical Spine  Complete  Result Date: 06/24/2017 CLINICAL DATA:  Chronic right-sided neck pain. EXAM: CERVICAL SPINE - COMPLETE 4+ VIEW COMPARISON:  None. FINDINGS: Straightening of the normal cervical lordosis. Degenerative spondylosis more pronounced at C5-6 than at C4-5 and C6-7, with disc space narrowing and endplate osteophytes. Mild osteophytic foraminal encroachment at the C5-6 level on the right. No evidence of facet arthropathy. IMPRESSION: Mid cervical spondylosis, most pronounced at C5-6, which could be symptomatic. Electronically Signed   By: Paulina FusiMark  Shogry M.D.   On: 06/24/2017 11:26   Dg Lumbar Spine Complete  Result Date: 06/24/2017 CLINICAL DATA:  History of previous injury with subsequent back pain. EXAM: LUMBAR SPINE - COMPLETE 4+ VIEW COMPARISON:  MRI of the lumbar spine of February 06, 2017 FINDINGS: The lumbar vertebral bodies are preserved in height. S1 is transitional. The disc space heights are well maintained. The pedicles and transverse processes are intact. The observed portions of the sacrum are normal. IMPRESSION: There is no acute or significant chronic bony abnormality of the lumbar spine. Previously demonstrated anterolisthesis of L4 with respect L5 is not demonstrated today. Electronically Signed   By: David  SwazilandJordan M.D.   On: 06/24/2017 11:26     ASSESSMENT and PLAN 1. Encounter for medical examination to establish care PMH, PSH, meds, allergies, Fhx, Shx, reviewed with patient today. Requesting records from Gyn and previous PCP including recent labs done. Further workup depending on lab results.   2. Bilateral low back pain with sciatica, sciatica laterality unspecified, unspecified chronicity Discussed supportive measures, OTC pain medication and RTC precautions. Patient educational handout given. - DG Lumbar Spine Complete; Future  3. Chronic neck pain See above - DG Cervical Spine Complete; Future  4. Femoral hernia of right side Refer to Gen Surg if hernia concerned as  unable to palpate during exam - US Abdomen Limited; Future  No Follow-up on file.    Myles LippsIrma M Santiago, MD Primary Care at [facility] [address] [place], [place] [address] Ph.  [phone] Fax [phone]

## 2017-06-25 ENCOUNTER — Encounter: Payer: Self-pay | Admitting: Family Medicine

## 2017-07-03 ENCOUNTER — Telehealth: Payer: Self-pay

## 2017-07-03 NOTE — Telephone Encounter (Signed)
error 

## 2017-07-07 ENCOUNTER — Other Ambulatory Visit: Payer: Self-pay | Admitting: Endocrinology

## 2017-07-07 DIAGNOSIS — E041 Nontoxic single thyroid nodule: Secondary | ICD-10-CM

## 2017-07-08 ENCOUNTER — Ambulatory Visit: Payer: Commercial Managed Care - PPO | Admitting: Obstetrics & Gynecology

## 2017-07-08 ENCOUNTER — Other Ambulatory Visit: Payer: Commercial Managed Care - PPO

## 2017-07-10 ENCOUNTER — Other Ambulatory Visit (INDEPENDENT_AMBULATORY_CARE_PROVIDER_SITE_OTHER): Payer: Commercial Managed Care - PPO

## 2017-07-10 DIAGNOSIS — E041 Nontoxic single thyroid nodule: Secondary | ICD-10-CM | POA: Diagnosis not present

## 2017-07-10 LAB — TSH: TSH: 0.26 u[IU]/mL — AB (ref 0.35–4.50)

## 2017-07-10 LAB — T4, FREE: FREE T4: 0.71 ng/dL (ref 0.60–1.60)

## 2017-07-12 ENCOUNTER — Ambulatory Visit (INDEPENDENT_AMBULATORY_CARE_PROVIDER_SITE_OTHER): Payer: Commercial Managed Care - PPO | Admitting: Endocrinology

## 2017-07-12 ENCOUNTER — Encounter: Payer: Self-pay | Admitting: Endocrinology

## 2017-07-12 VITALS — BP 110/70 | HR 71 | Ht 60.32 in | Wt 130.4 lb

## 2017-07-12 DIAGNOSIS — E041 Nontoxic single thyroid nodule: Secondary | ICD-10-CM

## 2017-07-12 NOTE — Progress Notes (Signed)
Patient ID: Cathy Kim, female   DOB: [birth year], 51 y.o.   MRN: [id number]            Reason for Appointment: Follow-up of thyroid nodule  Referring physician: Dr. Reynaldo MiniumJuan Fernandez   History of Present Illness:   The patient's thyroid nodule was first discovered in 02/2007 on a routine exam The thyroid ultrasound report is not available However she had a needle aspiration done for her left-sided thyroid nodule which was reported as benign goiter  She was seen in 11/17, at bedtime clinically her nodule was about the same size and she had only a slightly low TSH without physical hyperthyroidism no further evaluation was done  RECENT history  Patient is here for annual follow-up She says that periodically she has difficulty swallowing.  No local pressure symptoms, hoarseness or choking She does not complain of any fatigue Again her TSH is slightly low but free T4 and free T3 are normal   Lab Results  Component Value Date   FREET4 0.71 07/10/2017   FREET4 1.0 06/07/2017   FREET4 0.77 01/04/2017   TSH 0.26 (L) 07/10/2017   TSH 0.37 (L) 06/07/2017   TSH 0.81 01/04/2017   Lab Results  Component Value Date   T3FREE 3.3 06/07/2017   T3FREE 3.7 01/04/2017   T3FREE 3.5 07/19/2016     Cytology report from 07/2007: The specimen contains small to medium sized sheets of banal appearing follicular epithelial cells, some with oncocytic cell changes. In addition, in the background, there is abundantinflammation and scattered colloid.  Allergies as of 07/12/2017   No Known Allergies     Medication List    as of 07/12/2017  9:32 AM   You have not been prescribed any medications.     Allergies: No Known Allergies  Past Medical History:  Diagnosis Date  . Thyroid nodule    benign FNA per patient     No past surgical history on file.  Family History  Problem Relation Age of Onset  . Diabetes Father   . Osteoporosis Mother   . GER disease Mother   . Prostate  cancer Sister   . Thyroid disease Paternal Aunt        Goiter    Social History:  reports that  has never smoked. she has never used smokeless tobacco. She reports that she does not drink alcohol or use drugs.   Review of Systems:  She complains of feeling tired. She said even when she sleeps well she will not feel rested in the morning No complaints of dizziness She has occasional feelings of rapid heartbeat No recent swelling in her feet. No shortness of breath on exertion Has not had any menstrual cycles, if his age in 51/17 showed her to be in menopause, has not been recommended HRT at present  Has not had any significant weight change lately although her weight is relatively high than a couple of years ago  Wt Readings from Last 3 Encounters:  07/12/17 130 lb 6.4 oz (59.1 kg)  06/24/17 132 lb 9.6 oz (60.1 kg)  02/19/17 135 lb (61.2 kg)           Examination:   BP 110/70   Pulse 71   Ht 5' 0.32" (1.532 m)   Wt 130 lb 6.4 oz (59.1 kg)   LMP 12/19/2015   SpO2 95%   BMI 25.20 kg/m           Thyroid nodule is palpable on the left middle lobe  and is roughly 1.5 cm in size She says that during palpation on the left side she feels a pressure sensation Right lobe not palpable  There is no lymphadenopathy in the neck   Reflexes at biceps are normal.    Assessment/Plan:  Thyroid nodule on the left: She has had a benign thyroid nodule since 2008 Clinical exam shows about the same size of thyroid nodule as in 2017  Since her thyroid nodule has been relatively small in this past and today clinically does not appear unchanged unlikely that her intermittent swallowing difficulty is related  However for comparison will get a follow-up ultrasound done Since her thyroid levels do not indicate subclinical hyperthyroidism will not do any nuclear scan at this time She will also follow-up in 6 months to repeat thyroid levels   [facility],Armel Rabbani 07/12/2017

## 2017-07-18 ENCOUNTER — Ambulatory Visit
Admission: RE | Admit: 2017-07-18 | Discharge: 2017-07-18 | Disposition: A | Discharge status: Home / Self Care | Payer: Commercial Managed Care - PPO | Source: Ambulatory Visit | Attending: Endocrinology | Admitting: Endocrinology

## 2017-07-18 DIAGNOSIS — E041 Nontoxic single thyroid nodule: Secondary | ICD-10-CM

## 2017-07-24 ENCOUNTER — Telehealth: Payer: Self-pay | Admitting: Endocrinology

## 2017-07-24 NOTE — Telephone Encounter (Signed)
The nodules have been there for several years and there is no connection with her voice.  She can request a consultation with ENT physician from her PCP for this

## 2017-07-24 NOTE — Telephone Encounter (Signed)
Called patient back and left a voice message for her to call us back to go over the lab results.

## 2017-07-24 NOTE — Telephone Encounter (Signed)
Patient returned call re: Ultrasound. Please call patient at ph# [phone]

## 2017-07-24 NOTE — Telephone Encounter (Signed)
Please see message in result note. Patient called back and she stated that she is worried that the way she talks and changes her voice could be causing the nodules. She talks differently to her grandson and she wanted to know if this could cause the nodules.  Please advise.

## 2017-07-25 ENCOUNTER — Other Ambulatory Visit: Payer: Self-pay | Admitting: Endocrinology

## 2017-07-25 ENCOUNTER — Encounter: Payer: Self-pay | Admitting: Internal Medicine

## 2017-07-25 ENCOUNTER — Ambulatory Visit: Payer: Commercial Managed Care - PPO | Admitting: Family Medicine

## 2017-07-25 DIAGNOSIS — R1310 Dysphagia, unspecified: Secondary | ICD-10-CM

## 2017-07-25 NOTE — Telephone Encounter (Signed)
Called patient and she stated that she does not have a PCP. She stated that she has difficulty swallowing food and she asked if you could put in a referral for her?  Please advise.

## 2017-07-25 NOTE — Telephone Encounter (Signed)
We will refer her to gastroenterology for difficulty swallowing.  She does need to establish with a PCP for general medical issues and she can call the main [facility] primary care office for this

## 2017-07-26 NOTE — Telephone Encounter (Signed)
Called patient and let her know of the message from Dr. Lucianne MussKumar. She stated the gastroenterology has already called her and I gave her the main number to [facility] and she stated that [facility] would be closer to her home and she will set up an appointment with them.

## 2017-07-31 ENCOUNTER — Ambulatory Visit: Payer: Commercial Managed Care - PPO | Admitting: Obstetrics & Gynecology

## 2017-07-31 ENCOUNTER — Other Ambulatory Visit: Payer: Commercial Managed Care - PPO

## 2017-08-15 ENCOUNTER — Ambulatory Visit: Payer: Commercial Managed Care - PPO | Admitting: Family Medicine

## 2017-08-21 ENCOUNTER — Ambulatory Visit: Payer: Commercial Managed Care - PPO | Admitting: Family Medicine

## 2017-08-21 DIAGNOSIS — Z0289 Encounter for other administrative examinations: Secondary | ICD-10-CM

## 2017-09-11 ENCOUNTER — Ambulatory Visit: Payer: Commercial Managed Care - PPO | Admitting: Internal Medicine

## 2017-09-11 ENCOUNTER — Encounter (INDEPENDENT_AMBULATORY_CARE_PROVIDER_SITE_OTHER): Payer: Self-pay

## 2017-09-11 ENCOUNTER — Telehealth: Payer: Self-pay

## 2017-09-11 ENCOUNTER — Encounter: Payer: Self-pay | Admitting: Internal Medicine

## 2017-09-11 VITALS — BP 96/70 | HR 72 | Ht 59.25 in | Wt 131.1 lb

## 2017-09-11 DIAGNOSIS — R490 Dysphonia: Secondary | ICD-10-CM | POA: Diagnosis not present

## 2017-09-11 DIAGNOSIS — R1310 Dysphagia, unspecified: Secondary | ICD-10-CM | POA: Diagnosis not present

## 2017-09-11 NOTE — Telephone Encounter (Signed)
Cathy PihJackie- please call Pt and schedule NP appt at her convenience. Thank you.

## 2017-09-11 NOTE — Telephone Encounter (Signed)
Please advise 

## 2017-09-11 NOTE — Telephone Encounter (Signed)
Okay to schedule a new patient appointment.

## 2017-09-11 NOTE — Patient Instructions (Signed)

## 2017-09-11 NOTE — Telephone Encounter (Signed)
Copied from CRM [id number]. Topic: Appointment Scheduling - New Patient >> Sep 11, 2017  4:39 PM Trula SladeWalter, Linda F wrote: This patient would like a new patient appt with Dr. Drue NovelPaz, but she was told he is not taking any new patients.  She wanted Dr. Drue NovelPaz to be asked because they both came from [place] same country.  She is the owner of [employer].  She said he will remember that.  Please advise. [phone]

## 2017-09-11 NOTE — Progress Notes (Signed)
HISTORY OF PRESENT ILLNESS:  Cathy Kim is a 52 y.o. female , native of [place] and current Community education officeraviation technician with [employer],[name], lipid profile, [name], visual change, cough, ankle swelling, which change  Past Medical History:  Diagnosis Date  . Thyroid nodule    benign FNA per patient    Past Surgical History:  Procedure Laterality Date  . none      Social History Cathy Kim  reports that  has never smoked. she has never used smokeless tobacco. She reports that she does not drink alcohol or use drugs.  family history includes Diabetes in her father; [name]; [name]; [name]; [name]     PHYSICAL EXAMINATION: Vital signs: BP 96/70   Pulse 72   Ht 4' 11.25" (1.505 m)   Wt 131 lb 2 oz (59.5 kg)   LMP 12/19/2015   BMI 26.26 kg/m   Constitutional: generally well-appearing, no acute distress Psychiatric: alert and oriented x3, cooperative Eyes: extraocular  movements intact, anicteric, conjunctiva pink Mouth: oral pharynx moist, no lesions Neck: supple without thyromegaly. Nodule on the left Lymph: no supraclavicular lymphadenopathy Cardiovascular: heart regular rate and rhythm, no murmur Lungs: clear to auscultation bilaterally Abdomen: soft, nontender, nondistended, no obvious ascites, no peritoneal signs, normal bowel sounds, no organomegaly Rectal:omitted Extremities: no clubbing, cyanosis, or lower extremity edema bilaterally Skin: no lesions on visible extremities Neuro: No focal deficits. Normal DTRs.No asterixis.   ASSESSMENT:  #1. Intermittent solid food dysphagia and hoarseness. May be reflux equivalent. Rule out esophageal stricture. May need PPI if felt secondary to GERD. We discussed this #2. Colon cancer screening. Due at age 52. To be discussed on follow-up #3. Questions regarding workplace chemicals. I deferred this to her workplace Dr. or PCP  PLAN:  #1. Upper endoscopy with possible esophageal dilation.The nature of the procedure, as well as the risks, benefits, and alternatives were carefully and thoroughly reviewed with the patient. Ample time for discussion and questions allowed. The patient understood, was satisfied, and agreed to proceed. #2. Follow-up thereafter   A copy of this consultation note has been sent to Dr. Lucianne MussKumar

## 2017-09-12 NOTE — Telephone Encounter (Signed)
NP appt scheduled 09/19/2017.

## 2017-09-12 NOTE — Telephone Encounter (Signed)
LVM for pt to return call and to schedule new pt appt, approved by Provider

## 2017-09-19 ENCOUNTER — Ambulatory Visit: Payer: Commercial Managed Care - PPO | Admitting: Internal Medicine

## 2017-09-19 ENCOUNTER — Encounter: Payer: Self-pay | Admitting: Internal Medicine

## 2017-09-19 ENCOUNTER — Ambulatory Visit (HOSPITAL_BASED_OUTPATIENT_CLINIC_OR_DEPARTMENT_OTHER)
Admission: RE | Admit: 2017-09-19 | Discharge: 2017-09-19 | Disposition: A | Discharge status: Home / Self Care | Payer: Commercial Managed Care - PPO | Source: Ambulatory Visit | Attending: Internal Medicine | Admitting: Internal Medicine

## 2017-09-19 VITALS — BP 110/78 | HR 69 | Temp 98.3°F | Resp 16 | Ht 60.0 in | Wt 129.0 lb

## 2017-09-19 DIAGNOSIS — S6992XA Unspecified injury of left wrist, hand and finger(s), initial encounter: Secondary | ICD-10-CM

## 2017-09-19 DIAGNOSIS — R42 Dizziness and giddiness: Secondary | ICD-10-CM | POA: Diagnosis not present

## 2017-09-19 DIAGNOSIS — M542 Cervicalgia: Secondary | ICD-10-CM

## 2017-09-19 DIAGNOSIS — Z09 Encounter for follow-up examination after completed treatment for conditions other than malignant neoplasm: Secondary | ICD-10-CM | POA: Insufficient documentation

## 2017-09-19 DIAGNOSIS — X58XXXA Exposure to other specified factors, initial encounter: Secondary | ICD-10-CM | POA: Diagnosis not present

## 2017-09-19 NOTE — Patient Instructions (Signed)
GO TO THE FRONT DESK Schedule your next appointment for a  checkup in 3 months   STOP BY THE FIRST FLOOR:  get the XR    

## 2017-09-19 NOTE — Progress Notes (Signed)
Subjective:    Patient ID: Cathy Kim, female    DOB: 04/28/1966, 52 y.o.   MRN: [id number]  DOS:  09/19/2017 Type of visit - description : new pt, to get established, likes to discuss multiple issues Interval history:  8 years history of on and off nuchal pain, described as a pulling or sharp pain, has approximately 3 episodes a year, last 20 minutes, is not associated with upper or lower extremity paresthesias. Has consulted other physicians, x-ray 06/24/2017 show changes consistent with DJD, had a MRI?[facility] Kitchen.  Also, was doing laundry last week and she feels she injured her DIP, left fifth finger.  There was swelling and TTP.  Reports that for the last 2 years when she is at work and  exposed to chemicals she feels sometimes dizzy and nauseated.  No associated headache, diplopia, slurred speech or cough. At least one time that was discussed with her supervisor, was sent to [place]  [facility] [place], [place]up was done.  Was told something about her "lung results"  was abnormal.  Would like labs done  in reference to above.  Chart is reviewed, had a CMP, FLP, vitamin D and CBC done 06-2017 and all essentially normal.    Review of Systems   Past Medical History:  Diagnosis Date  . Thyroid nodule    benign FNA per patient    Past Surgical History:  Procedure Laterality Date  . none    . OVARIAN CYST REMOVAL Left 1994    Social History   Socioeconomic History  . Marital status: Married    Spouse name: Karie SodaSegundo Dollar  . Number of children: 3  . Years of education: Not on file  . Highest education level: Not on file  Social Needs  . Financial resource strain: Not hard at all  . Food insecurity - worry: Never true  . Food insecurity - inability: Never true  . Transportation needs - medical: No  . Transportation needs - non-medical: No  Occupational History  . Occupation: Community education officeraviation technician  Tobacco Use  . Smoking status: Never Smoker  . Smokeless tobacco: Never Used   Substance and Sexual Activity  . Alcohol use: No    Alcohol/week: 0.0 oz    Frequency: Never  . Drug use: No  . Sexual activity: Yes    Partners: Male  Other Topics Concern  . Not on file  Social History Narrative  . Not on file      Allergies as of 09/19/2017   No Known Allergies     Medication List    as of 09/19/2017  9:19 AM   You have not been prescribed any medications.        Objective:   Physical Exam BP 110/78 (BP Location: Left Arm, Patient Position: Sitting, Cuff Size: Small)   Pulse 69   Temp 98.3 F (36.8 C) (Oral)   Resp 16   Ht 5' (1.524 m)   Wt 129 lb (58.5 kg)   LMP 12/19/2015   SpO2 100%   BMI 25.19 kg/m  General:   Well developed, well nourished . NAD.  HEENT:  Normocephalic . Face symmetric, atraumatic Neck: No TTP, range of motion normal Lungs:  CTA B Normal respiratory effort, no intercostal retractions, no accessory muscle use. Heart: RRR,  no murmur.  No pretibial edema bilaterally  Skin: Not pale. Not jaundice Neurologic:  alert & oriented X3.  Speech normal, gait appropriate for age and unassisted. Motor and DTR symmetric Psych--  Cognition and judgment appear intact.  Cooperative with normal attention span and concentration.  Behavior appropriate. No anxious or depressed appearing.      Assessment & Plan:   Assessment (new pt 09-2017)  Dysphagia: Saw GI 08-2017, Rx EGD. 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 Menopausal 2018  Plan: Neck pain: As described above, recent x-ray showed DJD, rec formal PT Fifth left finger injury: Rx x-ray.  Further advise with results Episodes of dizziness and nausea: Patient thinks related to a chemical exposure at work, had a workup at [place] [facility], will get records. H/o femoral hernia, per patient, for years.  Chart is reviewed, saw another provider 06-2017, no hernia on exam.  Was referred to general surgery, pt plans  to see them at some point. RTC 3 months

## 2017-09-19 NOTE — Assessment & Plan Note (Signed)
Neck pain: As described above, recent x-ray showed DJD, rec formal PT Fifth left finger injury: Rx x-ray.  Further advise with results Episodes of dizziness and nausea: Patient thinks related to a chemical exposure at work, had a workup at [facility], will get records. H/o femoral hernia, per patient, for years.  Chart is reviewed, saw another provider 06-2017, no hernia on exam.  Was referred to general surgery, pt plans to see them at some point. RTC 3 months

## 2017-09-24 ENCOUNTER — Telehealth: Payer: Self-pay | Admitting: *Deleted

## 2017-09-24 NOTE — Telephone Encounter (Signed)
[facility]; forwarded to provider/SLS 02/12

## 2017-09-25 NOTE — Telephone Encounter (Signed)
Records placed in MD red folder for review.  

## 2017-09-30 NOTE — Telephone Encounter (Signed)
  Seen by Dr. Nehemiah Settlepolite 03/08/2016 with GI symptoms Was seen with dizziness, nausea , fatigue   by Dr Celene SkeenIbarra  ~ 06/04/2017: CMP, CBC, EKG essentially normal. No other information.

## 2017-11-01 ENCOUNTER — Telehealth: Payer: Self-pay | Admitting: Internal Medicine

## 2017-11-01 NOTE — Telephone Encounter (Signed)
Please advise. Pt speaks spanish.

## 2017-11-01 NOTE — Telephone Encounter (Signed)
LMOM: Her x-ray of the hand back in February 2019 was normal. If she has had breast issue needs to be seen, recommend to call next week and make an appointment.  Urgent care if symptoms severe.

## 2017-11-01 NOTE — Telephone Encounter (Signed)
Copied from CRM [id number]. Topic: Quick Communication - See Telephone Encounter >> Nov 01, 2017 11:11 AM Arlyss Gandyichardson, Peachie Barkalow N, NT wrote: CRM for notification. See Telephone encounter for: 11/01/17. Pt would like a call to go over her xray results she had in February. Also needs a referral so that she can make an appointment with [facility] [place] for some "burning" she has had in her breast. Requesting that someone call her who speaks spanish.

## 2017-11-04 ENCOUNTER — Other Ambulatory Visit: Payer: Self-pay

## 2017-11-04 ENCOUNTER — Encounter: Payer: Self-pay | Admitting: Internal Medicine

## 2017-11-04 ENCOUNTER — Ambulatory Visit (AMBULATORY_SURGERY_CENTER): Payer: Commercial Managed Care - PPO | Admitting: Internal Medicine

## 2017-11-04 VITALS — BP 112/71 | HR 58 | Temp 99.3°F | Resp 16 | Ht 60.0 in | Wt 129.0 lb

## 2017-11-04 DIAGNOSIS — R1310 Dysphagia, unspecified: Secondary | ICD-10-CM | POA: Diagnosis not present

## 2017-11-04 MED ORDER — SODIUM CHLORIDE 0.9 % IV SOLN: 500.0000 mL | Freq: Once | INTRAVENOUS | Status: DC

## 2017-11-04 MED ORDER — OMEPRAZOLE 40 MG PO CPDR: 40.0000 mg | DELAYED_RELEASE_CAPSULE | Freq: Every day | ORAL | 6 refills | Status: DC

## 2017-11-04 NOTE — Op Note (Signed)
[facility] Patient Name: Cathy Kim Procedure Date: 11/04/2017 10:47 AM MRN: [id number] Endoscopist: Wilhemina BonitoJohn N. Marina GoodellPerry , MD Age: 52 Referring MD:  Date of Birth: 02-09-1966 Gender: Female Account #: [id number] Procedure:                [facility]                            esophagus 3267f Indications:              Dysphagia, Laryngitis/Hoarseness Medicines:                Monitored Anesthesia Care Procedure:                Pre-Anesthesia Assessment:                           - Prior to the procedure, a History and Physical                            was performed, and patient medications and                            allergies were reviewed. The patient's tolerance of                            previous anesthesia was also reviewed. The risks                            and benefits of the procedure and the sedation                            options and risks were discussed with the patient.                            All questions were answered, and informed consent                            was obtained. Prior Anticoagulants: The patient has                            taken no previous anticoagulant or antiplatelet                            agents. ASA Grade Assessment: I - A normal, healthy                            patient. After reviewing the risks and benefits,                            the patient was deemed in satisfactory condition to                            undergo the procedure.  After obtaining informed consent, the endoscope was                            passed under direct vision. Throughout the                            procedure, the patient's blood pressure, pulse, and                            oxygen saturations were monitored continuously. The                            Endoscope was introduced through the mouth, and                            advanced to the second part of duodenum.  The upper                            GI endoscopy was accomplished without difficulty.                            The patient tolerated the procedure well. Scope In: Scope Out: Findings:                 The esophagus was normal. The scope was withdrawn.                            Dilation was performed with a Maloney dilator with                            no resistance at 52 Fr.                           The stomach was normal.                           The examined duodenum was normal.                           The cardia and gastric fundus were normal on                            retroflexion. Complications:            No immediate complications. Estimated Blood Loss:     Estimated blood loss: none. Impression:               - Normal esophagus. Dilated.                           - Normal stomach.                           - Normal examined duodenum.                           - No specimens collected. Recommendation:  1. Post-dilation diet                           2. Prescribe omeprazole 40 mg daily; #30; 6 refills                           3. Office follow-up with Dr. Marina Goodell in about 2                            months. Wilhemina Bonito. Marina Goodell, MD 11/04/2017 11:01:23 AM This report has been signed electronically.

## 2017-11-04 NOTE — Progress Notes (Signed)
Called to room to assist during endoscopic procedure.  Patient ID and intended procedure confirmed with present staff. Received instructions for my participation in the procedure from the performing physician.  

## 2017-11-04 NOTE — Patient Instructions (Signed)
YOU HAD AN ENDOSCOPIC PROCEDURE TODAY AT THE [facility]:   Refer to the procedure report that was given to you for any specific questions about what was found during the examination.  If the procedure report does not answer your questions, please call your gastroenterologist to clarify.  If you requested that your care partner not be given the details of your procedure findings, then the procedure report has been included in a sealed envelope for you to review at your convenience later.  YOU SHOULD EXPECT: Some feelings of bloating in the abdomen. Passage of more gas than usual.  Walking can help get rid of the air that was put into your GI tract during the procedure and reduce the bloating. If you had a lower endoscopy (such as a colonoscopy or flexible sigmoidoscopy) you may notice spotting of blood in your stool or on the toilet paper. If you underwent a bowel prep for your procedure, you may not have a normal bowel movement for a few days.  Please Note:  You might notice some irritation and congestion in your nose or some drainage.  This is from the oxygen used during your procedure.  There is no need for concern and it should clear up in a day or so.  SYMPTOMS TO REPORT IMMEDIATELY:    Following upper endoscopy (EGD)  Vomiting of blood or coffee ground material  New chest pain or pain under the shoulder blades  Painful or persistently difficult swallowing  New shortness of breath  Fever of 100F or higher  Black, tarry-looking stools  For urgent or emergent issues, a gastroenterologist can be reached at any hour by calling [phone].   DIET:  PLease see post dilation diet sheet for today then you may proceed to your regular diet.  Drink plenty of fluids but you should avoid alcoholic beverages for 24 hours.  ACTIVITY:  You should plan to take it easy for the rest of today and you should NOT DRIVE or use heavy machinery until tomorrow (because of the sedation medicines  used during the test).    FOLLOW UP: Our staff will call the number listed on your records the next business day following your procedure to check on you and address any questions or concerns that you may have regarding the information given to you following your procedure. If we do not reach you, we will leave a message.  However, if you are feeling well and you are not experiencing any problems, there is no need to return our call.  We will assume that you have returned to your regular daily activities without incident.  If any biopsies were taken you will be contacted by phone or by letter within the next 1-3 weeks.  Please call us at [phone] if you have not heard about the biopsies in 3 weeks.    SIGNATURES/CONFIDENTIALITY: You and/or your care partner have signed paperwork which will be entered into your electronic medical record.  These signatures attest to the fact that that the information above on your After Visit Summary has been reviewed and is understood.  Full responsibility of the confidentiality of this discharge information lies with you and/or your care-partner.  Thank you for letting us take care of your healthcare needs today.

## 2017-11-04 NOTE — Progress Notes (Signed)
To recovery, report to RN, VSS. 

## 2017-11-05 ENCOUNTER — Telehealth: Payer: Self-pay

## 2017-11-05 NOTE — Telephone Encounter (Signed)
  Follow up Call-  Call back number 11/04/2017  Post procedure Call Back phone  # [phone]  Permission to leave phone message Yes  Some recent data might be hidden     Patient questions:  Do you have a fever, pain , or abdominal swelling? No. Pain Score  0 *  Have you tolerated food without any problems? Yes.    Have you been able to return to your normal activities? Yes.    Do you have any questions about your discharge instructions: Diet   No. Medications  No. Follow up visit  No.  Do you have questions or concerns about your Care? No.  Actions: * If pain score is 4 or above: No action needed, pain <4.  No problems noted per pt. maw

## 2017-11-06 ENCOUNTER — Encounter: Payer: Self-pay | Admitting: Internal Medicine

## 2017-11-06 ENCOUNTER — Ambulatory Visit: Payer: Commercial Managed Care - PPO | Admitting: Internal Medicine

## 2017-11-06 VITALS — BP 124/72 | HR 57 | Temp 98.2°F | Resp 14 | Ht 60.0 in | Wt 131.0 lb

## 2017-11-06 DIAGNOSIS — M79646 Pain in unspecified finger(s): Secondary | ICD-10-CM | POA: Diagnosis not present

## 2017-11-06 DIAGNOSIS — R002 Palpitations: Secondary | ICD-10-CM

## 2017-11-06 DIAGNOSIS — N644 Mastodynia: Secondary | ICD-10-CM

## 2017-11-06 LAB — CBC WITH DIFFERENTIAL/PLATELET
BASOS PCT: 0.4 % (ref 0.0–3.0)
Basophils Absolute: 0 10*3/uL (ref 0.0–0.1)
EOS PCT: 0.7 % (ref 0.0–5.0)
Eosinophils Absolute: 0 10*3/uL (ref 0.0–0.7)
HEMATOCRIT: 39.9 % (ref 36.0–46.0)
HEMOGLOBIN: 12.7 g/dL (ref 12.0–15.0)
LYMPHS PCT: 32.9 % (ref 12.0–46.0)
Lymphs Abs: 1.7 10*3/uL (ref 0.7–4.0)
MCHC: 31.9 g/dL (ref 30.0–36.0)
MCV: 86.6 fl (ref 78.0–100.0)
Monocytes Absolute: 0.3 10*3/uL (ref 0.1–1.0)
Monocytes Relative: 5.9 % (ref 3.0–12.0)
NEUTROS ABS: 3 10*3/uL (ref 1.4–7.7)
Neutrophils Relative %: 60.1 % (ref 43.0–77.0)
PLATELETS: 206 10*3/uL (ref 150.0–400.0)
RBC: 4.61 Mil/uL (ref 3.87–5.11)
RDW: 18.6 % — AB (ref 11.5–15.5)
WBC: 5.1 10*3/uL (ref 4.0–10.5)

## 2017-11-06 LAB — T3, FREE: T3, Free: 3.6 pg/mL (ref 2.3–4.2)

## 2017-11-06 LAB — T4, FREE: Free T4: 0.73 ng/dL (ref 0.60–1.60)

## 2017-11-06 LAB — TSH: TSH: 0.29 u[IU]/mL — ABNORMAL LOW (ref 0.35–4.50)

## 2017-11-06 NOTE — Patient Instructions (Signed)
GO TO THE LAB : Get the blood work     GO TO THE FRONT DESK Schedule your next appointment for a checkup in 6 weeks  ER if severe palpitations  Start taking OTC vitamin E supplements approximately 400 units daily

## 2017-11-06 NOTE — Progress Notes (Signed)
Pre visit review using our clinic review tool, if applicable. No additional management support is needed unless otherwise documented below in the visit note. 

## 2017-11-06 NOTE — Progress Notes (Signed)
Subjective:    Patient ID: Cathy Kim, female    DOB: [birth year], 52 y.o.   MRN: [id number]  DOS:  11/06/2017 Type of visit - description : acute  Interval history: Left breast tenderness for the last 5-6 weeks: The pain is generalized in the breast, described as sharp, burning, happen every 2-3 days and pain mid last few minutes. She feels several lumps in her breast tissue on self-exam but no dominant ones. Right breast is also affected but symptoms are minimal Denies any rash, nipple discharge or bleeding No family history of breast cancer Has been menopausal for at least one year. No history of abnormal mammograms  Also, 10 months history of palpitations described as increased heart rate, decreased within 10 seconds after she takes a deep breath. Symptoms usually happen at work, she does not have a physical job, work environment is not stressful She has a couple of episodes a month. Denies near syncope, chest pain, diaphoresis, nausea or vomiting.  Also,  continue with having pains at the L fifth finger  Review of Systems As above Past Medical History:  Diagnosis Date  . Thyroid nodule    benign FNA per patient    Past Surgical History:  Procedure Laterality Date  . OVARIAN CYST REMOVAL Left 1994    Social History   Socioeconomic History  . Marital status: Married    Spouse name: Karie SodaSegundo [place]  . Number of children: 3  . Years of education: Not on file  . Highest education level: Not on file  Occupational History  . Occupation: Community education officeraviation technician    Comment: [employer]ologist, [employer]  Social Needs  . Financial resource strain: Not hard at all  . Food insecurity:    Worry: Never true    Inability: Never true  . Transportation needs:    Medical: No    Non-medical: No  Tobacco Use  . Smoking status: Never Smoker  . Smokeless tobacco: Never Used  Substance and Sexual Activity  . Alcohol use: No    Alcohol/week: 0.0 oz    Frequency: Never  . Drug use: No  . Sexual  activity: Yes    Partners: Male  Lifestyle  . Physical activity:    Days per week: 0 days    Minutes per session: Not on file  . Stress: Not at all  Relationships  . Social connections:    Talks on phone: More than three times a week    Gets together: Once a week    Attends religious service: Never    Active member of club or organization: No    Attends meetings of clubs or organizations: Never    Relationship status: Married  . Intimate partner violence:    Fear of current or ex partner: No    Emotionally abused: No    Physically abused: No    Forced sexual activity: No  Other Topics Concern  . Not on file  Social History Narrative   Original from [place]   Married, 3 children, 2 children at home      Allergies as of 11/06/2017   No Known Allergies     Medication List        Accurate as of 11/06/17 12:11 PM. Always use your most recent med list.          omeprazole 40 MG capsule Commonly known as:  PRILOSEC Take 1 capsule (40 mg total) by mouth daily.          Objective:  Physical Exam BP 124/72 (BP Location: Left Arm, Patient Position: Sitting, Cuff Size: Small)   Pulse (!) 57   Temp 98.2 F (36.8 C) (Oral)   Resp 14   Ht 5' (1.524 m)   Wt 131 lb (59.4 kg)   LMP 12/19/2015   SpO2 97%   BMI 25.58 kg/m  General:   Well developed, well nourished . NAD.  HEENT:  Normocephalic . Face symmetric, atraumatic Neck: No thyromegaly Breast: Both breasts are quite nodular without dominant mass, slightly TTP. skin and nipples normal to inspection on palpation, axillary areas without mass or lymphadenopathy Lungs:  CTA B Normal respiratory effort, no intercostal retractions, no accessory muscle use. Heart: RRR,  no murmur.  no pretibial edema bilaterally  Abdomen:  Not distended, soft, non-tender. No rebound or rigidity.   Skin: Not pale. Not jaundice Neurologic:  alert & oriented X3.  Speech normal, gait appropriate for age and unassisted Psych--    Cognition and judgment appear intact.  Cooperative with normal attention span and concentration.  Behavior appropriate. No anxious or depressed appearing.     Assessment & Plan:   Assessment (new pt 09-2017)  Dysphagia: Saw GI 08-2017, Rx EGD.  EGD 3-25: Normal, dilated. 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 Menopausal 2018  Plan: Mastalgia: Physical exam showed breasts are quite nodular, otherwise normal.  Will recommend bilateral mammograms and ultrasound.  Fibrocystic disease?Marland Kitchen  Recommend vitamin E. Palpitations: As described above, no red flag symptoms, recheck a CBC and TFTs (last TSH was a slightly suppressed before). EKG today sinus rhythm, no acute changes, no different from previous. Order echo Holter would be unlikely to capture any arrhythmias given the infrequency of symptoms. ER if symptoms severe Continue with left finger  PIP discomfort: Refer to hand surgery RTC 6 weeks

## 2017-11-07 NOTE — Assessment & Plan Note (Signed)
Mastalgia: Physical exam showed breasts are quite nodular, otherwise normal.  Will recommend bilateral mammograms and ultrasound.  Fibrocystic disease?Marland Kitchen.  Recommend vitamin E. Palpitations: As described above, no red flag symptoms, recheck a CBC and TFTs (last TSH was a slightly suppressed before). EKG today sinus rhythm, no acute changes, no different from previous. Order echo Holter would be unlikely to capture any arrhythmias given the infrequency of symptoms. ER if symptoms severe Continue with left finger  PIP discomfort: Refer to hand surgery RTC 6 weeks

## 2017-11-08 ENCOUNTER — Other Ambulatory Visit (HOSPITAL_COMMUNITY): Payer: Commercial Managed Care - PPO

## 2017-11-08 DIAGNOSIS — R0989 Other specified symptoms and signs involving the circulatory and respiratory systems: Secondary | ICD-10-CM

## 2017-11-11 ENCOUNTER — Encounter: Payer: Self-pay | Admitting: Obstetrics & Gynecology

## 2017-11-11 ENCOUNTER — Ambulatory Visit (INDEPENDENT_AMBULATORY_CARE_PROVIDER_SITE_OTHER): Payer: Commercial Managed Care - PPO | Admitting: Obstetrics & Gynecology

## 2017-11-11 VITALS — BP 110/72 | Ht <= 58 in | Wt 130.0 lb

## 2017-11-11 DIAGNOSIS — Z8742 Personal history of other diseases of the female genital tract: Secondary | ICD-10-CM

## 2017-11-11 DIAGNOSIS — Z13220 Encounter for screening for lipoid disorders: Secondary | ICD-10-CM

## 2017-11-11 DIAGNOSIS — Z13820 Encounter for screening for osteoporosis: Secondary | ICD-10-CM

## 2017-11-11 DIAGNOSIS — Z780 Asymptomatic menopausal state: Secondary | ICD-10-CM | POA: Diagnosis not present

## 2017-11-11 NOTE — Progress Notes (Addendum)
    Cathy Kim [birth year] [id number]        52 y.o.  G4P0013   RP: Presents to discuss lab results from visit in October 2018  HPI: Patient is in menopause, well without hormone replacement therapy.  She had an episode of postmenopausal bleeding in September 2018, no recurrence of postmenopausal bleeding since then.  No pelvic pain.  Normal vaginal secretions.  Her labs in October 2018 were normal except for a low TSH which was repeated in November 2018 and was still low.  Her free T3 and free T4 were normal.  Her FSH was 33 confirming menopause in October 2018.   OB History  Gravida Para Term Preterm AB Living  '4 3     1 3  '$ SAB TAB Ectopic Multiple Live Births  1            # Outcome Date GA Lbr Len/2nd Weight Sex Delivery Anes PTL Lv  4 SAB           3 Para           2 Para           1 Para             Past medical history,surgical history, problem list, medications, allergies, family history and social history were all reviewed and documented in the EPIC chart.   Directed ROS with pertinent positives and negatives documented in the history of present illness/assessment and plan.  Exam:  Vitals:   11/11/17 0939  BP: 110/72  Weight: 130 lb (59 kg)  Height: '4\' 9"'$  (1.448 m)   General appearance:  Normal  Gynecologic exam:  Deferred   Assessment/Plan:  52 y.o. G4P0013   1. History of postmenopausal bleeding No recurrence of postmenopausal bleeding since September 2018.  No pelvic pain.  Well on no hormone replacement therapy.  Probable perimenopause explaining the bleeding in September 2018.  Given no recurrence and no pelvic pain for more than 6 months, decision to observe without further investigation. - CBC - TSH - T4, free - T3, free  2. Menopause present Well on no hormone replacement therapy.  [place] confirmed menopause at 81 in October 2018.  No postmenopausal bleeding anymore, since September 2018.  3. Screening for osteoporosis Recommend vitamin D  supplements, calcium rich nutrition and regular weightbearing physical activity.  Will schedule bone density as soon as possible.   - VITAMIN D 25 Hydroxy (Vit-D Deficiency, Fractures)  4. Lipid screening and Metabolic profile - Lipid panel - Comp Met (CMET)  Counseling on above issues and coordination of care more than 50% of 25 minutes.  Princess Bruins MD, 10:11 AM 11/11/2017

## 2017-11-12 ENCOUNTER — Telehealth: Payer: Self-pay | Admitting: Internal Medicine

## 2017-11-12 LAB — CBC
HEMATOCRIT: 37.8 % (ref 35.0–45.0)
HEMOGLOBIN: 12.4 g/dL (ref 11.7–15.5)
MCH: 27.6 pg (ref 27.0–33.0)
MCHC: 32.8 g/dL (ref 32.0–36.0)
MCV: 84 fL (ref 80.0–100.0)
MPV: 11.4 fL (ref 7.5–12.5)
Platelets: 206 10*3/uL (ref 140–400)
RBC: 4.5 10*6/uL (ref 3.80–5.10)
RDW: 16.7 % — ABNORMAL HIGH (ref 11.0–15.0)
WBC: 5.3 10*3/uL (ref 3.8–10.8)

## 2017-11-12 LAB — COMPREHENSIVE METABOLIC PANEL
AG Ratio: 1.7 (calc) (ref 1.0–2.5)
ALBUMIN MSPROF: 4.3 g/dL (ref 3.6–5.1)
ALKALINE PHOSPHATASE (APISO): 93 U/L (ref 33–115)
ALT: 14 U/L (ref 6–29)
AST: 16 U/L (ref 10–35)
BUN: 14 mg/dL (ref 7–25)
CO2: 30 mmol/L (ref 20–32)
CREATININE: 0.67 mg/dL (ref 0.50–1.10)
Calcium: 9.9 mg/dL (ref 8.6–10.2)
Chloride: 104 mmol/L (ref 98–110)
Globulin: 2.6 g/dL (calc) (ref 1.9–3.7)
Glucose, Bld: 95 mg/dL (ref 65–99)
POTASSIUM: 4.5 mmol/L (ref 3.5–5.3)
Sodium: 141 mmol/L (ref 135–146)
Total Bilirubin: 0.6 mg/dL (ref 0.2–1.2)
Total Protein: 6.9 g/dL (ref 6.1–8.1)

## 2017-11-12 LAB — LIPID PANEL
Cholesterol: 174 mg/dL (ref <200)
HDL: 65 mg/dL (ref >50)
LDL Cholesterol (Calc): 94 mg/dL (calc)
NON-HDL CHOLESTEROL (CALC): 109 mg/dL (ref <130)
Total CHOL/HDL Ratio: 2.7 (calc) (ref <5.0)
Triglycerides: 61 mg/dL (ref <150)

## 2017-11-12 LAB — T4, FREE: Free T4: 1 ng/dL (ref 0.8–1.8)

## 2017-11-12 LAB — T3, FREE: T3 FREE: 3.4 pg/mL (ref 2.3–4.2)

## 2017-11-12 LAB — TSH: TSH: 0.52 mIU/L

## 2017-11-12 LAB — VITAMIN D 25 HYDROXY (VIT D DEFICIENCY, FRACTURES): VIT D 25 HYDROXY: 26 ng/mL — AB (ref 30–100)

## 2017-11-12 NOTE — Telephone Encounter (Signed)
Copied from CRM [phone]. Topic: Quick Communication - See Telephone Encounter >> Nov 12, 2017  2:21 PM Valentina LucksMatos, Jackelin wrote: CRM for notification. See Telephone encounter for: 11/12/17.   Pt dropped off document to be filled out by provider (FMLA- 4 pages [place]) (pt stated it was for her appt on November 04, 2017 (one day only) having an Endoscopic done. Pt would like to be called at [phone] when document ready to be picked up. Document [facility] to [facility].

## 2017-11-13 ENCOUNTER — Encounter: Payer: Self-pay | Admitting: Obstetrics & Gynecology

## 2017-11-13 NOTE — Patient Instructions (Signed)
1. History of postmenopausal bleeding No recurrence of postmenopausal bleeding since September 2018.  No pelvic pain.  Well on no hormone replacement therapy.  Probable perimenopause explaining the bleeding in September 2018.  Given no recurrence and no pelvic pain for more than 6 months, decision to observe without further investigation. - CBC - TSH - T4, free - T3, free  2. Menopause present Well on no hormone replacement therapy.  [place] confirmed menopause at 26 in October 2018.  No postmenopausal bleeding anymore, since September 2018.  3. Screening for osteoporosis Recommend vitamin D supplements, calcium rich nutrition and regular weightbearing physical activity.  Will schedule bone density as soon as possible.   - VITAMIN D 25 Hydroxy (Vit-D Deficiency, Fractures)  4. Lipid screening and Metabolic profile - Lipid panel - Comp Met (CMET)  Earnest Bailey, it was a pleasure seeing you today!  I will inform you of your results as soon as available.

## 2017-11-15 ENCOUNTER — Other Ambulatory Visit (HOSPITAL_COMMUNITY): Payer: Commercial Managed Care - PPO

## 2017-11-19 ENCOUNTER — Ambulatory Visit: Payer: Commercial Managed Care - PPO

## 2017-11-19 ENCOUNTER — Ambulatory Visit
Admission: RE | Admit: 2017-11-19 | Discharge: 2017-11-19 | Disposition: A | Discharge status: Home / Self Care | Payer: Commercial Managed Care - PPO | Source: Ambulatory Visit | Attending: Internal Medicine | Admitting: Internal Medicine

## 2017-11-21 ENCOUNTER — Ambulatory Visit: Payer: Commercial Managed Care - PPO | Admitting: Interventional Cardiology

## 2017-11-21 ENCOUNTER — Encounter: Payer: Self-pay | Admitting: Interventional Cardiology

## 2017-11-21 VITALS — BP 102/66 | HR 74 | Ht <= 58 in | Wt 130.4 lb

## 2017-11-21 DIAGNOSIS — R002 Palpitations: Secondary | ICD-10-CM

## 2017-11-21 DIAGNOSIS — M79605 Pain in left leg: Secondary | ICD-10-CM | POA: Diagnosis not present

## 2017-11-21 DIAGNOSIS — M79604 Pain in right leg: Secondary | ICD-10-CM

## 2017-11-21 DIAGNOSIS — R0789 Other chest pain: Secondary | ICD-10-CM | POA: Diagnosis not present

## 2017-11-21 NOTE — Progress Notes (Signed)
Cardiology Office Note   Date:  11/21/2017   ID:  Cathy Kim, DOB [birth year], MRN [id number]  PCP:  Cathy Kim, Cathy E, MD    No chief complaint on file.  palpitations  Wt Readings from Last 3 Encounters:  11/21/17 130 lb 6.4 oz (59.1 kg)  11/11/17 130 lb (59 kg)  11/06/17 131 lb (59.4 kg)       History of Present Illness: Cathy GauzeBlanca Del Bethanie Dickerilar Bartosik is a 52 y.o. female who is being seen today for the evaluation of palpitations at the request of Cathy Kim, Cathy E, MD.  Per Dr. Drue NovelPaz: "Also, 10 months history of palpitations described as increased heart rate, decreased within 10 seconds after she takes a deep breath. Symptoms usually happen at work, she does not have a physical job, work environment is not stressful She has a couple of episodes a month. Denies near syncope, chest pain, diaphoresis, nausea or vomiting."  She confirms the history above.  Episodes are a few times a month and last up to 10 seconds. She has to take deep breaths to help palpitations.  She did not exercise up until 3 weeks ago.  She started stretching exercises 3 weeks ago. No palpitations with these exercises.  She avoids caffeine due to cysts in her breasts.  She also reports some leg pain.  She had some burning on both soles of the feet.  She occasionally feels that her arms fall asleep.  She works at [employer].  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  She feels some back pain.       Past Medical History:  Diagnosis Date  . Thyroid nodule    benign FNA per patient    Past Surgical History:  Procedure Laterality Date  . OVARIAN CYST REMOVAL Left 1994     Current Outpatient Medications  Medication Sig Dispense Refill  . omeprazole (PRILOSEC) 40 MG capsule Take 1 capsule (40 mg total) by mouth daily. 30 capsule 6   Current Facility-Administered Medications  Medication Dose Route Frequency Provider Last Rate Last Dose  . 0.9 %  sodium chloride infusion  500  mL Intravenous Once Hilarie FredricksonPerry, John N, MD        Allergies:   Patient has no known allergies.    Social History:  The patient  reports that she has never smoked. She has never used smokeless tobacco. She reports that she does not drink alcohol or use drugs.   Family History:  The patient's family history includes Diabetes in her father; [name]; Osteoarthritis in her sister; [name]; Prostate cancer in her father; Thyroid disease in her paternal aunt.    ROS:  Please see the history of present illness.   Otherwise, review of systems are positive for palpitations.   All other systems are reviewed and negative.    PHYSICAL EXAM: VS:  BP 102/66 (BP Location: Left Arm, Patient Position: Sitting, Cuff Size: Normal)   Pulse 74   Ht 4\' 9"  (1.448 m)   Wt 130 lb 6.4 oz (59.1 kg)   LMP 12/19/2015   SpO2 96%   BMI 28.22 kg/m  , BMI Body mass index is 28.22 kg/m. GEN: Well nourished, well developed, in no acute distress  HEENT: normal  Neck: no JVD, carotid bruits, or masses Cardiac: RRR; no murmurs, rubs, or gallops,no edema  Respiratory:  clear to auscultation bilaterally, normal work of breathing GI: soft, nontender, nondistended, +  BS MS: no deformity or atrophy  Skin: warm and dry, no rash Neuro:  Strength and sensation are intact Psych: euthymic mood, full affect   EKG:   The ekg ordered in 11/06/17 demonstrates NSR, no ST changes   Recent Labs: 11/11/2017: ALT 14; BUN 14; Creat 0.67; Hemoglobin 12.4; Platelets 206; Potassium 4.5; Sodium 141; TSH 0.52   Lipid Panel    Component Value Date/Time   CHOL 174 11/11/2017 1102   TRIG 61 11/11/2017 1102   HDL 65 11/11/2017 1102   CHOLHDL 2.7 11/11/2017 1102   VLDL 20 04/06/2016 1608   LDLCALC 94 11/11/2017 1102     Other studies Reviewed: Additional studies/ records that were reviewed today with results demonstrating: Prior ECG reviewed..  Free T4 normal on November 11, 2017.   ASSESSMENT AND  PLAN:  1. Palpitations: Plan for 30 day event monitor.  No evidence of structural heart disease by exam.  She has been paying more attention to any potential cardiac symptoms since her husband had bypass surgery about a year ago. 2. Chest burning: atypical.  Rare and not related to exertion.  I doubt ischemia in this patient.  Continue to monitor.  If she has further symptoms, could consider exercise treadmill test. 3. Leg pain: 2+ PT pulses bilaterally.  I don't think she is having vascular related pain.     Current medicines are reviewed at length with the patient today.  The patient concerns regarding her medicines were addressed.  The following changes have been made:  No change  Labs/ tests ordered today include:  No orders of the defined types were placed in this encounter.   Recommend 150 minutes/week of aerobic exercise Low fat, low carb, high fiber diet recommended  Disposition:   FU based on monitor results   Signed, Lance Muss, MD  11/21/2017 9:32 AM    [facility] [address] [place], [place], [place]  [address] Phone: [phone]; Fax: [phone]

## 2017-11-21 NOTE — Patient Instructions (Signed)
Medication Instructions:  Your physician recommends that you continue on your current medications as directed. Please refer to the Current Medication list given to you today.   Labwork: None ordered  Testing/Procedures: Your physician has recommended that you wear an event monitor. Event monitors are medical devices that record the heart's electrical activity. Doctors most often us these monitors to diagnose arrhythmias. Arrhythmias are problems with the speed or rhythm of the heartbeat. The monitor is a small, portable device. You can wear one while you do your normal daily activities. This is usually used to diagnose what is causing palpitations/syncope (passing out).  Follow-Up: Based on test results  Any Other Special Instructions Will Be Listed Below (If Applicable).   Cardiac Event Monitoring A cardiac event monitor is a small recording device that is used to detect abnormal heart rhythms (arrhythmias). The monitor is used to record your heart rhythm when you have symptoms, such as:  Fast heartbeats (palpitations), such as heart racing or fluttering.  Dizziness.  Fainting or light-headedness.  Unexplained weakness.  Some monitors are wired to electrodes placed on your chest. Electrodes are flat, sticky disks that attach to your skin. Other monitors may be hand-held or worn on the wrist. The monitor can be worn for up to 30 days. If the monitor is attached to your chest, a technician will prepare your chest for the electrode placement and show you how to work the monitor. Take time to practice using the monitor before you leave the office. Make sure you understand how to send the information from the monitor to your health care provider. In some cases, you may need to use a landline telephone instead of a cell phone. What are the risks? Generally, this device is safe to use, but it possible that the skin under the electrodes will become irritated. How to use your cardiac event  monitor  Wear your monitor at all times, except when you are in water: ? Do not let the monitor get wet. ? Take the monitor off when you bathe. Do not swim or use a hot tub with it on.  Keep your skin clean. Do not put body lotion or moisturizer on your chest.  Change the electrodes as told by your health care provider or any time they stop sticking to your skin. You may need to use medical tape to keep them on.  Try to put the electrodes in slightly different places on your chest to help prevent skin irritation. They must remain in the area under your left breast and in the upper right section of your chest.  Make sure the monitor is safely clipped to your clothing or in a location close to your body that your health care provider recommends.  Press the button to record as soon as you feel heart-related symptoms, such as: ? Dizziness. ? Weakness. ? Light-headedness. ? Palpitations. ? Thumping or pounding in your chest. ? Shortness of breath. ? Unexplained weakness.  Keep a diary of your activities, such as walking, doing chores, and taking medicine. It is very important to note what you were doing when you pushed the button to record your symptoms. This will help your health care provider determine what might be contributing to your symptoms.  Send the recorded information as recommended by your health care provider. It may take some time for your health care provider to process the results.  Change the batteries as told by your health care provider.  Keep electronic devices away from your monitor.   monitor. This includes: ? Tablets. ? MP3 players. ? Cell phones.  While wearing your monitor you should avoid: ? Electric blankets. ? Firefighterlectric razors. ? Electric toothbrushes. ? Microwave ovens. ? Magnets. ? Metal detectors. Get help right away if:  You have chest pain.  You have extreme difficulty breathing or shortness of breath.  You develop a very fast heartbeat that  persists.  You develop dizziness that does not go away.  You faint or constantly feel like you are about to faint. Summary  A cardiac event monitor is a small recording device that is used to help detect abnormal heart rhythms (arrhythmias).  The monitor is used to record your heart rhythm when you have heart-related symptoms.  Make sure you understand how to send the information from the monitor to your health care provider.  It is important to press the button on the monitor when you have any heart-related symptoms.  Keep a diary of your activities, such as walking, doing chores, and taking medicine. It is very important to note what you were doing when you pushed the button to record your symptoms. This will help your health care provider learn what might be causing your symptoms. This information is not intended to replace advice given to you by your health care provider. Make sure you discuss any questions you have with your health care provider. Document Released: 05/08/2008 Document Revised: 07/14/2016 Document Reviewed: 07/14/2016 Elsevier Interactive Patient Education  2017 Elsevier Inc.   Smurfit-Stone ContainerMonitoreo de eventos cardacos (Cardiac Event Monitoring) El monitor de eventos cardacos es un pequeo dispositivo de registro que se [place]utiliza para Engineer, manufacturingdetectar ritmos cardacos anormales (arritmias). El monitor se [place] Islandsutiliza para Mudloggerregistrar el ritmo cardaco cuando hay sntomas evidentes como:  Latidos cardacos rpidos (palpitaciones), como latidos acelerados o aleteos.  Mareos.  Desvanecimientos o desmayos.  Sensacin de debilidad sin motivo. El monitor se conecta a dos electrodos que se Immunologistcolocan en el pecho. Los electrodos son discos planos con pegamento, que se adhieren a Press photographerla piel. El monitor puede usarse hasta [address] das. Usar el monitor todo el tiempo, excepto cuando se de un bao. COMO USAR EL MONITOR DE EVENTOS CARDACOS Un tcnico preparar su pecho para colocar los electrodos. Le mostrar  cmo colocar los electrodos, cmo funciona el monitor y cmo reemplazar las bateras. Tmese un tiempo para Training and development officerpracticar el uso del monitor antes de Pensions consultantdejar el consultorio. Asegrese de que comprende cmo se enva la informacin desde el monitor a su mdico. Esto requerir un telfono de lnea, no un telfono celular. Puede ser necesario que:  Use el monitor todo el tiempo, excepto cuando est en el agua: ? No lo moje. ? Squelo para baarse. No practique natacin ni use la baera con el dispositivo colocado.  Mantenga la piel limpia. No se aplique lociones ni humectantes sobre el pecho.  Cambie los [place] Productselectrodos todos los das o cada vez que dejan de adherirse a Press photographerla piel. Puede ser necesario que use cinta adhesiva para Camera operatormantenerlos en el lugar.  Es posible que la piel que se encuentra debajo de los electrodos se irrite. Para evitar que esto ocurra, puede variar ligeramente la ubicacin de los electrodos. Sin embargo, deben United Technologies Corporationmantenerse en la zona debajo de la mama izquierda y en la seccin superior derecha del pecho.  Asegrese que el monitor est perfectamente ajustado a su ropa o en un lugar cercano a su cuerpo que el mdico recomiende.  Presione el botn para Passenger transport managerregistrar cundo siente sntomas cardacos, como mareos, debilidad, vahdos, palpitaciones, golpeteos, falta  de aire, debilidad sin motivo o aleteos o frecuencia acelerada en el corazn. El monitor est siempre encendido y Training and development officer lo que ocurre ligeramente antes de que usted presione el botn, por lo tanto no se preocupe si tarda para Personnel officer buena informacin.  Lleve un registro de sus actividades, como al caminar, al hacer tareas domsticas o [employer]. Es muy importante que anote lo que estaba haciendo cuando presiona el botn para Passenger transport manager sus sntomas. Esto ayudar al mdico a determinar que puede estar causando sus sntomas. La informacin almacenada en el monitor ser revisada por el mdico junto con los registros de su  diario.  Enve la informacin registrada del modo en que le indic el mdico. Es importante que comprenda que le llevar algn tiempo al mdico procesar los [place].  Cargue las bateras segn las indicaciones de su mdico. SOLICITE ATENCIN MDICA DE INMEDIATO SI:  Siente dolor en el pecho.  Tiene dificultad extrema para respirar o Company secretary.  Siente que los latidos cardacos son muy rpidos y esto persiste.  Comienza a Warehouse manager tos y [place] no se [place].  Se desmaya o tiene la sensacin constante de que se va a desvanecer.  Esta informacin no tiene Theme park manager el consejo del mdico. Asegrese de hacerle al mdico cualquier pregunta que tenga. Document Released: 11/24/2012 Document Revised: 05/20/2013 Document Reviewed: 01/26/2013 Elsevier Interactive Patient Education  2017 [employer].   If you need a refill on your cardiac medications before your next appointment, please call your pharmacy.

## 2017-11-21 NOTE — Telephone Encounter (Signed)
Provider out of office to confirm and/or deny completing FMLA paperwork for 1-day GI procedure; he will return on Mon, 11/25/17.Marland Kitchen.SLS

## 2017-11-22 NOTE — Telephone Encounter (Signed)
Pt called in to follow up on FMLA paperwork. Advised that provider is out of the office.

## 2017-12-11 ENCOUNTER — Telehealth: Payer: Self-pay | Admitting: Internal Medicine

## 2017-12-11 NOTE — Telephone Encounter (Signed)
Letter faxed per pt request.

## 2017-12-11 NOTE — Telephone Encounter (Signed)
Pt states she has been trying to get FMLA paperwork since 11/07/17 and was told incorrectly to go to Medical records at [facility] and then from there was told her PCP needed to fill out forms prolonging the form process. Pt states finally she was directed to [employer] at [address] where they finally have her forms and are still working on them. Pt is needing a letter sent to her HR fax# [phone] stating all of this and why the process has been taking so long.

## 2017-12-12 NOTE — Telephone Encounter (Signed)
Letter faxed.

## 2017-12-12 NOTE — Telephone Encounter (Signed)
Pt called requesting letter to be faxed to [facility], fax is [phone]. She forgot to give [place] this information yesterday.

## 2017-12-18 ENCOUNTER — Ambulatory Visit: Payer: Commercial Managed Care - PPO | Admitting: Internal Medicine

## 2017-12-19 ENCOUNTER — Encounter: Payer: Self-pay | Admitting: Internal Medicine

## 2017-12-19 ENCOUNTER — Telehealth: Payer: Self-pay | Admitting: Internal Medicine

## 2017-12-19 ENCOUNTER — Ambulatory Visit: Payer: Commercial Managed Care - PPO | Admitting: Internal Medicine

## 2017-12-19 VITALS — BP 116/74 | HR 67 | Temp 97.8°F | Resp 14 | Ht <= 58 in | Wt 126.0 lb

## 2017-12-19 DIAGNOSIS — R002 Palpitations: Secondary | ICD-10-CM | POA: Diagnosis not present

## 2017-12-19 DIAGNOSIS — N644 Mastodynia: Secondary | ICD-10-CM

## 2017-12-19 DIAGNOSIS — R197 Diarrhea, unspecified: Secondary | ICD-10-CM

## 2017-12-19 NOTE — Telephone Encounter (Signed)
Pt would like to have her sons establish care with Dr. Drue Novel. Mel Almond and Sanjuana Mae. Pt stated that she spoke with Dr. Drue Novel and he approved this.

## 2017-12-19 NOTE — Patient Instructions (Addendum)
GO TO THE FRONT DESK Schedule your next appointment for a  Physical exam in 6 to 8 months, fasting   Bland diet   Fluids   Probiotics once a day for 4 weeks   Call if not gradually better

## 2017-12-19 NOTE — Progress Notes (Signed)
Pre visit review using our clinic review tool, if applicable. No additional management support is needed unless otherwise documented below in the visit note. 

## 2017-12-19 NOTE — Progress Notes (Signed)
Subjective:    Patient ID: Cathy Kim, female    DOB: 1966/04/28, 52 y.o.   MRN: [id number]  DOS:  12/19/2017 Type of visit - description : f/u Interval history: Palpitations: Since the last office visit, she saw cardiology, she was ordered an event recorder but did not proceed.  Symptoms resolved. Mastalgia: Resolved 5 days ago she had a celebration at home, she ate a large fatty meal, shortly after developed diarrhea.  Having diarrhea since then, up to 6 episodes a day, watery. Over the last 2 days diarrhea has a slow down and the stools are more formed.  Wt Readings from Last 3 Encounters:  12/19/17 126 lb (57.2 kg)  11/21/17 130 lb 6.4 oz (59.1 kg)  11/11/17 130 lb (59 kg)     Review of Systems Denies fever chills No chest pain, difficulty breathing or lower extremity edema Has some nausea but no vomiting.  No abdominal pain, no blood in the stools.  Past Medical History:  Diagnosis Date  . Thyroid nodule    benign FNA per patient    Past Surgical History:  Procedure Laterality Date  . OVARIAN CYST REMOVAL Left 1994    Social History   Socioeconomic History  . Marital status: Married    Spouse name: Joanmarie Tsang  . Number of children: 3  . Years of education: Not on file  . Highest education level: Not on file  Occupational History  . Occupation: Community education officer    Comment: Psychologist, educational  Social Needs  . Financial resource strain: Not hard at all  . Food insecurity:    Worry: Never true    Inability: Never true  . Transportation needs:    Medical: No    Non-medical: No  Tobacco Use  . Smoking status: Never Smoker  . Smokeless tobacco: Never Used  Substance and Sexual Activity  . Alcohol use: No    Alcohol/week: 0.0 oz    Frequency: Never  . Drug use: No  . Sexual activity: Yes    Partners: Male  Lifestyle  . Physical activity:    Days per week: 0 days    Minutes per session: Not on file  . Stress: Not at all  Relationships  .  Social connections:    Talks on phone: More than three times a week    Gets together: Once a week    Attends religious service: Never    Active member of club or organization: No    Attends meetings of clubs or organizations: Never    Relationship status: Married  . Intimate partner violence:    Fear of current or ex partner: No    Emotionally abused: No    Physically abused: No    Forced sexual activity: No  Other Topics Concern  . Not on file  Social History Narrative   Original from [place]   Married, 3 children, 2 children at home      Allergies as of 12/19/2017   No Known Allergies     Medication List        Accurate as of 12/19/17 11:59 PM. Always use your most recent med list.          omeprazole 40 MG capsule Commonly known as:  PRILOSEC Take 1 capsule (40 mg total) by mouth daily.          Objective:   Physical Exam BP 116/74 (BP Location: Left Arm, Patient Position: Sitting, Cuff Size: Small)   Pulse 67  Temp 97.8 F (36.6 C) (Oral)   Resp 14   Ht  (1.448 m)   Wt 126 lb (57.2 kg)   LMP 12/19/2015   SpO2 94%   BMI 27.27 kg/m  General:   Well developed, well nourished . NAD.  HEENT:  Normocephalic . Face symmetric, atraumatic Lungs:  CTA B Normal respiratory effort, no intercostal retractions, no accessory muscle use. Heart: RRR,  no murmur.  no pretibial edema bilaterally  Abdomen:  Not distended, soft, non-tender. No rebound or rigidity.  Bowel sounds are increased.  Palpable, nontender aorta.  No bruit. Skin: Not pale. Not jaundice Neurologic:  alert & oriented X3.  Speech normal, gait appropriate for age and unassisted Psych--  Cognition and judgment appear intact.  Cooperative with normal attention span and concentration.  Behavior appropriate. No anxious or depressed appearing.     Assessment & Plan:   Assessment (new pt 09-2017)  Dysphagia: Saw GI 08-2017, Rx EGD.  EGD 3-25: Normal, dilated. 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 Menopausal 2018 Palpable aorta on exam 12/2017  Plan: Mastalgia: Resolved Palpitations: Labs were essentially normal, saw cardiology, event monitor was rx but she did not proceed.  Did not proceed with echo either.  Symptoms are now resolved. Diarrhea: As described above, no red flag symptoms, symptoms improving, related to heavy fatty meals?.  Recommend good hydration, probiotics, observation, also to call if not gradually back to normal in the next few days. Also, I agreed to see her 2 children as new patients RTC CPX 8 months.  Fasting.

## 2017-12-19 NOTE — Telephone Encounter (Signed)
Per Dr. Drue Novel- okay to schedule NP appts for both.

## 2017-12-20 NOTE — Telephone Encounter (Signed)
Called pt, but was unable to LVM due to her mailbox being full. Will attempt againat a later time.

## 2017-12-20 NOTE — Assessment & Plan Note (Signed)
Mastalgia: Resolved Palpitations: Labs were essentially normal, saw cardiology, event monitor was rx but she did not proceed.  Did not proceed with echo either.  Symptoms are now resolved. Diarrhea: As described above, no red flag symptoms, symptoms improving, related to heavy fatty meals?.  Recommend good hydration, probiotics, observation, also to call if not gradually back to normal in the next few days. Also, I agreed to see her 2 children as new patients RTC CPX 8 months.  Fasting.

## 2017-12-25 ENCOUNTER — Encounter: Payer: Self-pay | Admitting: Interventional Cardiology

## 2018-01-01 ENCOUNTER — Ambulatory Visit: Payer: Commercial Managed Care - PPO | Admitting: Internal Medicine

## 2018-01-01 NOTE — Telephone Encounter (Signed)
Provider deferred paperwork to GI; pt informed via bilingual phone conversation with Windell Moulding prior to last OV/SLS 05/20

## 2018-01-03 ENCOUNTER — Other Ambulatory Visit (INDEPENDENT_AMBULATORY_CARE_PROVIDER_SITE_OTHER): Payer: Commercial Managed Care - PPO

## 2018-01-03 DIAGNOSIS — E041 Nontoxic single thyroid nodule: Secondary | ICD-10-CM

## 2018-01-03 LAB — T4, FREE: Free T4: 0.72 ng/dL (ref 0.60–1.60)

## 2018-01-03 LAB — TSH: TSH: 0.55 u[IU]/mL (ref 0.35–4.50)

## 2018-01-03 LAB — T3, FREE: T3 FREE: 3.8 pg/mL (ref 2.3–4.2)

## 2018-01-07 ENCOUNTER — Encounter: Payer: Self-pay | Admitting: Endocrinology

## 2018-01-07 ENCOUNTER — Ambulatory Visit: Payer: Commercial Managed Care - PPO | Admitting: Endocrinology

## 2018-01-07 VITALS — BP 116/70 | HR 69 | Ht <= 58 in | Wt 128.0 lb

## 2018-01-07 DIAGNOSIS — E041 Nontoxic single thyroid nodule: Secondary | ICD-10-CM

## 2018-01-07 NOTE — Progress Notes (Signed)
Patient ID: Cathy Kim, female   DOB: 08-31-1965, 52 y.o.   MRN: [id number]            Reason for Appointment: Follow-up of thyroid   Referring physician: Dr. Reynaldo Minium   History of Present Illness:   The patient's thyroid nodule was first discovered in 02/2007 on a routine exam The thyroid ultrasound report is not available However she had a needle aspiration done for her left-sided thyroid nodule which was reported as benign goiter   RECENT history  Patient is here for periodic follow-up  On her last visit she had a mildly decreased TSH level without increased T4 or T3 and she is here for follow-up No recent palpitations Labs are back to normal currently with TSH 0.55  She says that periodically she has a feeling of tightness in the side of her neck bilaterally  Lab Results  Component Value Date   FREET4 0.72 01/03/2018   FREET4 1.0 11/11/2017   FREET4 0.73 11/06/2017   TSH 0.55 01/03/2018   TSH 0.52 11/11/2017   TSH 0.29 (L) 11/06/2017   Lab Results  Component Value Date   T3FREE 3.8 01/03/2018   T3FREE 3.4 11/11/2017   T3FREE 3.6 11/06/2017    THYROID nodule: He had a 1.5 cm nodule on her last exam Repeat ultrasound showed multiple small nodules with largest 1.3 cm on the left and no follow-up recommended on radiological characteristics   Cytology report from 07/2007: The specimen contains small to medium sized sheets of banal appearing follicular epithelial cells, some with oncocytic cell changes. In addition, in the background, there is abundantinflammation and scattered colloid.  Allergies as of 01/07/2018   No Known Allergies     Medication List        Accurate as of 01/07/18 10:58 AM. Always use your most recent med list.          omeprazole 40 MG capsule Commonly known as:  PRILOSEC Take 1 capsule (40 mg total) by mouth daily.       Allergies: No Known Allergies  Past Medical History:  Diagnosis Date  . Thyroid  nodule    benign FNA per patient     Past Surgical History:  Procedure Laterality Date  . OVARIAN CYST REMOVAL Left 1994    Family History  Problem Relation Age of Onset  . Diabetes Father   . Prostate cancer Father   . Osteoporosis Mother   . GER disease Mother   . Thyroid disease Paternal Aunt        Goiter  . Osteoarthritis Sister   . Colon cancer Neg Hx   . Liver cancer Neg Hx   . Stomach cancer Neg Hx   . Esophageal cancer Neg Hx     Social History:  reports that she has never smoked. She has never used smokeless tobacco. She reports that she does not drink alcohol or use drugs.   Review of Systems:  Leg pain as above, mostly on  getting stressed        Examination:   BP 116/70 (BP Location: Left Arm, Patient Position: Sitting, Cuff Size: Normal)   Pulse 69   Ht  (1.448 m)   Wt 128 lb (58.1 kg)   LMP 12/19/2015   SpO2 98%   BMI 27.70 kg/m           Thyroid nodule is palpable on the left middle lobe and is about 1.5 cm in size, this is smooth and  rounded  Right lobe not palpable  There is no lymphadenopathy in the neck   Reflexes at biceps are normal.    Assessment/Plan:  Thyroid nodule on the left: She has had a benign thyroid nodule since 2008 Clinical exam shows about the same size of thyroid nodule as on the last visit  This on ultrasound was measuring 1.3 cm and likely unchanged from 2008 She was reassured that her nodules are tiny and benign and do not need any further follow-up  She had a minimal decrease in TSH previously which is now consistently normal and no indication of subclinical hypothyroid  She will follow-up annually now   Reather Littler 01/07/2018

## 2018-01-26 ENCOUNTER — Other Ambulatory Visit: Payer: Self-pay | Admitting: Internal Medicine

## 2018-02-12 ENCOUNTER — Ambulatory Visit: Payer: Commercial Managed Care - PPO | Admitting: Internal Medicine

## 2018-05-13 ENCOUNTER — Other Ambulatory Visit: Payer: Self-pay | Admitting: *Deleted

## 2018-05-13 DIAGNOSIS — Z13820 Encounter for screening for osteoporosis: Secondary | ICD-10-CM

## 2018-05-26 ENCOUNTER — Ambulatory Visit: Payer: Commercial Managed Care - PPO | Admitting: Obstetrics & Gynecology

## 2018-05-30 ENCOUNTER — Ambulatory Visit: Payer: Commercial Managed Care - PPO | Admitting: Obstetrics & Gynecology

## 2018-05-30 ENCOUNTER — Encounter: Payer: Self-pay | Admitting: Obstetrics & Gynecology

## 2018-05-30 VITALS — BP 124/82

## 2018-05-30 DIAGNOSIS — N950 Postmenopausal bleeding: Secondary | ICD-10-CM | POA: Diagnosis not present

## 2018-05-30 NOTE — Patient Instructions (Signed)
1. Postmenopausal bleeding Postmenopausal bleeding.  Normal gynecologic exam today.  Will rule out endometrial hyperplasia or cancer with an endometrial biopsy.  Endometrial biopsy done without difficulty, no complication.  Pending pathology.  Patient will follow-up for a pelvic ultrasound to rule out endometrial polyp or fibroid. - Pathology Report - US Transvaginal Non-OB; Future  Cathy Kim, good seeing you today!  Will inform you of the Endometrial Biopsy result as soon as it is available.

## 2018-05-30 NOTE — Progress Notes (Signed)
    Cathy Kim Cathy Kim 1966/08/07 [id number]        52 y.o.  [id number]  Married  RP:  PMB x 2 days 10 days ago  HPI: Light to moderate PMB x 2 days 10 days ago.  No Pelvic pain.  No vaginal d/c.  Menopause on no HRT.   OB History  Gravida Para Term Preterm AB Living  4 3     1 3   SAB TAB Ectopic Multiple Live Births  1            # Outcome Date GA Lbr Len/2nd Weight Sex Delivery Anes PTL Lv  4 SAB           3 Para           2 Para           1 Para             Past medical history,surgical history, problem list, medications, allergies, family history and social history were all reviewed and documented in the EPIC chart.   Directed ROS with pertinent positives and negatives documented in the history of present illness/assessment and plan.  Exam:  Vitals:   05/30/18 1014  BP: 124/82   General appearance:  Normal  Abdomen: Normal  Gynecologic exam: Vulva normal.  Speculum:  Cervix/Vagina normal.  Bimanual:    Verbal consent for Endometrial Bx obtained.  Betadine prep on cervix.  Hurricane spray.  Tenaculum on anterior lip of cervix.  Endometrial Bx with canula.  Moderate amount specimen.  Sent to pathology.  Instruments removed.  Good hemostasis.  Well tolerated.  No Cx.   Assessment/Plan:  52 y.o. [id number]   1. Postmenopausal bleeding Postmenopausal bleeding.  Normal gynecologic exam today.  Will rule out endometrial hyperplasia or cancer with an endometrial biopsy.  Endometrial biopsy done without difficulty, no complication.  Pending pathology.  Patient will follow-up for a pelvic ultrasound to rule out endometrial polyp or fibroid. - Pathology Report - US Transvaginal Non-OB; Future  Counseling on above issues and coordination of care more than 50% for 25 minutes.  Genia Del MD, 10:22 AM 05/30/2018

## 2018-06-03 LAB — TISSUE SPECIMEN

## 2018-06-03 LAB — PATHOLOGY

## 2018-08-11 ENCOUNTER — Encounter: Payer: Self-pay | Admitting: Obstetrics & Gynecology

## 2018-08-11 DIAGNOSIS — Z0289 Encounter for other administrative examinations: Secondary | ICD-10-CM

## 2018-09-24 ENCOUNTER — Ambulatory Visit: Payer: Commercial Managed Care - PPO | Admitting: Internal Medicine

## 2018-09-25 ENCOUNTER — Telehealth: Payer: Self-pay

## 2018-09-25 ENCOUNTER — Ambulatory Visit: Payer: Commercial Managed Care - PPO | Admitting: Internal Medicine

## 2018-09-25 ENCOUNTER — Encounter: Payer: Self-pay | Admitting: Internal Medicine

## 2018-09-25 VITALS — BP 116/76 | HR 64 | Temp 98.2°F | Resp 16 | Ht <= 58 in | Wt 127.4 lb

## 2018-09-25 DIAGNOSIS — R197 Diarrhea, unspecified: Secondary | ICD-10-CM

## 2018-09-25 DIAGNOSIS — M21611 Bunion of right foot: Secondary | ICD-10-CM

## 2018-09-25 MED ORDER — CIPROFLOXACIN HCL 500 MG PO TABS: 500.0000 mg | ORAL_TABLET | Freq: Two times a day (BID) | ORAL | 0 refills | Status: DC

## 2018-09-25 NOTE — Patient Instructions (Signed)
Drink plenty of fluids  Take the antibiotic ciprofloxacin for 3 days  Get a probiotic such as align , take one daily for a month  Bland diet  Call if not gradually better in the next  couple of weeks

## 2018-09-25 NOTE — Telephone Encounter (Signed)
Copied from CRM [id number]. Topic: General - Inquiry >> Sep 25, 2018 11:52 AM Lynne Logan D wrote: Reason for CRM: Jesusita Oka with [facility] called and stated pt was turned away from her OV with Dr. Drue Novel today due to no insurance coverage. He stated pt's coverage was efective 09/19/18 and has not had a chance to update yet in the system. They would like to speak with someone in office to update pt's insurance information. Please advise. CB# [phone] Ask for Jesusita Oka ext [phone] or talk to any representative

## 2018-09-25 NOTE — Telephone Encounter (Signed)
Pt was seen today with provider, pt came in office and stated had UMR and at the moment of verification insurance was not active (insurance was verify with our system also was verify with [employer]). Pt was informed that she needed to verify with insurance why insurance was coming up inactive, pt decided to see provider as Self Pay and would inform us about insurance.  After pt was seen in our office pt called the insurance company and spoke with [facility] which inform me that pt is covered with insurance UMR with a different subscriber ID and that insurance is supposed to have coverage even if it came up as not active.   Pt was given back her self pay (refunded) and also manuelly was resubmitted with UMR with the correct Subcriber ID for today's visit.

## 2018-09-25 NOTE — Progress Notes (Signed)
Subjective:    Patient ID: Cathy Kim, female    DOB: Jan 11, 1966, 53 y.o.   MRN: [id number]  DOS:  09/25/2018 Type of visit - description: Acute The patient went to [place] for several weeks starting in December, approximately in mid January she started to have diarrhea. 3 or 4 episodes a day, diarrhea is watery, often times postprandial. Denies other symptoms.  Also, has a right-sided bunion, is causing a lot of discomfort.  Review of Systems No fever chills Appetite is normal No nausea, vomiting, no blood in the stool or abdominal pain. No GERD.  Past Medical History:  Diagnosis Date  . Thyroid nodule    benign FNA per patient    Past Surgical History:  Procedure Laterality Date  . OVARIAN CYST REMOVAL Left 1994    Social History   Socioeconomic History  . Marital status: Married    Spouse name: Mykeria Guardiola  . Number of children: 3  . Years of education: Not on file  . Highest education level: Not on file  Occupational History  . Occupation: Community education officer    Comment: Psychologist, educational  Social Needs  . Financial resource strain: Not hard at all  . Food insecurity:    Worry: Never true    Inability: Never true  . Transportation needs:    Medical: No    Non-medical: No  Tobacco Use  . Smoking status: Never Smoker  . Smokeless tobacco: Never Used  Substance and Sexual Activity  . Alcohol use: No    Alcohol/week: 0.0 standard drinks    Frequency: Never  . Drug use: No  . Sexual activity: Yes    Partners: Male  Lifestyle  . Physical activity:    Days per week: 0 days    Minutes per session: Not on file  . Stress: Not at all  Relationships  . Social connections:    Talks on phone: More than three times a week    Gets together: Once a week    Attends religious service: Never    Active member of club or organization: No    Attends meetings of clubs or organizations: Never    Relationship status: Married  . Intimate partner violence:   Fear of current or ex partner: No    Emotionally abused: No    Physically abused: No    Forced sexual activity: No  Other Topics Concern  . Not on file  Social History Narrative   Original from [place]   Married, 3 children, 2 children at home      Allergies as of 09/25/2018   No Known Allergies     Medication List    as of September 25, 2018 10:49 AM   You have not been prescribed any medications.         Objective:   Physical Exam BP 116/76 (BP Location: Left Arm, Patient Position: Sitting, Cuff Size: Small)   Pulse 64   Temp 98.2 F (36.8 C) (Oral)   Resp 16   Ht 4\' 9"  (1.448 m)   Wt 127 lb 6 oz (57.8 kg)   LMP 12/19/2015   SpO2 98%   BMI 27.56 kg/m  General:   Well developed, NAD, BMI noted.  HEENT:  Normocephalic . Face symmetric, atraumatic Lungs:  CTA B Normal respiratory effort, no intercostal retractions, no accessory muscle use. Heart: RRR,  no murmur.  no pretibial edema bilaterally  Abdomen:  Not distended, soft, non-tender. No rebound or rigidity. Increased bowel  sounds otherwise normal MSK: Bunion, right-sided. Skin: Not pale. Not jaundice Neurologic:  alert & oriented X3.  Speech normal, gait appropriate for age and unassisted Psych--  Cognition and judgment appear intact.  Cooperative with normal attention span and concentration.  Behavior appropriate. No anxious or depressed appearing.     Assessment      Assessment (new pt 09-2017)  Dysphagia: Saw GI 08-2017, Rx EGD.  EGD 3-25: Normal, dilated. 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 Menopausal 2018 Palpable aorta on exam 12/2017  Plan: Diarrhea: Acute diarrhea, started while she was visiting [place] for several weeks; while there, she did visit the  jungle but did not eat anything "too exotic". Suspect traveler's diarrhea, no evidence of colitis.  Diarrhea is mild; patient is not dehydrated. (She was seen with  diarrhea also back in May 2019, symptoms were largely resolved, this is a new episode) Plan: Cipro, probiotics, push fluids, Pepto-Bismol as needed.  If not gradually better, will proceed with further evaluation.   Bunion: Right side, very painful, surgery?  Refer to orthopedic surgery. Social: Lost her father July 21, 2018.  Condolences provided.

## 2018-09-25 NOTE — Progress Notes (Signed)
Pre visit review using our clinic review tool, if applicable. No additional management support is needed unless otherwise documented below in the visit note. 

## 2018-09-26 NOTE — Assessment & Plan Note (Signed)
Diarrhea: Acute diarrhea, started while she was visiting [place] for several weeks; while there, she did visit the  jungle but did not eat anything "too exotic". Suspect traveler's diarrhea, no evidence of colitis.  Diarrhea is mild; patient is not dehydrated. (She was seen with diarrhea also back in May 2019, symptoms were largely resolved, this is a new episode) Plan: Cipro, probiotics, push fluids, Pepto-Bismol as needed.  If not gradually better, will proceed with further evaluation.   Bunion: Right side, very painful, surgery?  Refer to orthopedic surgery. Social: Lost her father July 21, 2018.  Condolences provided.

## 2018-10-02 ENCOUNTER — Ambulatory Visit (INDEPENDENT_AMBULATORY_CARE_PROVIDER_SITE_OTHER): Payer: Commercial Managed Care - PPO | Admitting: Orthopedic Surgery

## 2018-11-12 ENCOUNTER — Telehealth (INDEPENDENT_AMBULATORY_CARE_PROVIDER_SITE_OTHER): Payer: Self-pay

## 2018-11-12 NOTE — Telephone Encounter (Signed)
I called and lm on vm to advise calling for prescreen questions for COVID-19  prior to appt. Asked pt to call back

## 2018-11-13 ENCOUNTER — Ambulatory Visit (INDEPENDENT_AMBULATORY_CARE_PROVIDER_SITE_OTHER): Payer: Self-pay | Admitting: Orthopedic Surgery

## 2019-03-07 ENCOUNTER — Encounter: Payer: Self-pay | Admitting: Internal Medicine

## 2019-03-12 ENCOUNTER — Other Ambulatory Visit: Payer: Self-pay | Admitting: Internal Medicine

## 2019-03-12 DIAGNOSIS — Z1231 Encounter for screening mammogram for malignant neoplasm of breast: Secondary | ICD-10-CM

## 2019-03-29 ENCOUNTER — Other Ambulatory Visit: Payer: Self-pay | Admitting: Endocrinology

## 2019-03-29 DIAGNOSIS — E041 Nontoxic single thyroid nodule: Secondary | ICD-10-CM

## 2019-04-02 ENCOUNTER — Other Ambulatory Visit: Payer: Self-pay

## 2019-04-02 ENCOUNTER — Other Ambulatory Visit (INDEPENDENT_AMBULATORY_CARE_PROVIDER_SITE_OTHER): Payer: Commercial Managed Care - PPO

## 2019-04-02 DIAGNOSIS — E041 Nontoxic single thyroid nodule: Secondary | ICD-10-CM | POA: Diagnosis not present

## 2019-04-03 LAB — T4, FREE: Free T4: 0.78 ng/dL (ref 0.60–1.60)

## 2019-04-03 LAB — TSH: TSH: 0.4 u[IU]/mL (ref 0.35–4.50)

## 2019-04-07 ENCOUNTER — Encounter: Payer: Self-pay | Admitting: Endocrinology

## 2019-04-07 ENCOUNTER — Ambulatory Visit (INDEPENDENT_AMBULATORY_CARE_PROVIDER_SITE_OTHER): Payer: Commercial Managed Care - PPO | Admitting: Endocrinology

## 2019-04-07 ENCOUNTER — Other Ambulatory Visit: Payer: Self-pay

## 2019-04-07 VITALS — BP 122/82 | HR 63 | Ht <= 58 in | Wt 128.6 lb

## 2019-04-07 DIAGNOSIS — E041 Nontoxic single thyroid nodule: Secondary | ICD-10-CM | POA: Diagnosis not present

## 2019-04-07 NOTE — Progress Notes (Signed)
Patient ID: Cathy Kim, female   DOB: October 11, 1965, 53 y.o.   MRN: [id number]            Reason for Appointment: Follow-up of thyroid nodule  Referring physician: Dr. Uvaldo Rising   History of Present Illness:   The patient's thyroid nodule was first discovered in 02/2007 on a routine exam The thyroid ultrasound report is not available However she had a needle aspiration done for her left-sided thyroid nodule which was reported as benign goiter   RECENT history  Patient is here for annual follow-up  Previously had some difficulty swallowing which apparently resolved after endoscopy and treatment by gastroenterologist  She does not complain of any pressure or tightness and no difficulty swallowing now No unusual fatigue  On her visit in 10/2017 she had a mildly decreased TSH level without increased T4 or T3 Subsequently TSH has been consistently normal although below 1.0   Lab Results  Component Value Date   FREET4 0.78 04/02/2019   FREET4 0.72 01/03/2018   FREET4 1.0 11/11/2017   TSH 0.40 04/02/2019   TSH 0.55 01/03/2018   TSH 0.52 11/11/2017   Lab Results  Component Value Date   T3FREE 3.8 01/03/2018   T3FREE 3.4 11/11/2017   T3FREE 3.6 11/06/2017    THYROID nodule: He had a 1.5 cm nodule on her last exam Repeat ultrasound showed multiple small nodules, has 4 nodules over 1 cm Largest nodule 1.3 cm on the left inferior location and follow-up recommended on radiological characteristics   Cytology report from needle aspiration of the left-sided nodule in 07/2007: The specimen contains small to medium sized sheets of banal appearing follicular epithelial cells, some with oncocytic cell changes. In addition, in the background, there is abundantinflammation and scattered colloid. Diagnosis: Nonneoplastic goiter  Allergies as of 04/07/2019   No Known Allergies     Medication List       Accurate as of April 07, 2019  9:12 PM. If you have any  questions, ask your nurse or doctor.        STOP taking these medications   ciprofloxacin 500 MG tablet Commonly known as: Cipro Stopped by: Elayne Snare, MD       Allergies: No Known Allergies  Past Medical History:  Diagnosis Date  . Thyroid nodule    benign FNA per patient     Past Surgical History:  Procedure Laterality Date  . OVARIAN CYST REMOVAL Left 1994    Family History  Problem Relation Age of Onset  . Diabetes Father   . Prostate cancer Father   . Osteoporosis Mother   . GER disease Mother   . Thyroid disease Paternal Aunt        Goiter  . Osteoarthritis Sister   . Colon cancer Neg Hx   . Liver cancer Neg Hx   . Stomach cancer Neg Hx   . Esophageal cancer Neg Hx     Social History:  reports that she has never smoked. She has never used smokeless tobacco. She reports that she does not drink alcohol or use drugs.   Review of Systems:  She is asking about some joint pains and pain in her right buttock area Has other multiple complaints which she has not followed up with PCP for   Examination:   BP 122/82 (BP Location: Right Arm, Patient Position: Sitting, Cuff Size: Normal)   Pulse 63   Ht 4\' 9"  (1.448 m)   Wt 128 lb 9.6 oz (58.3 kg)  LMP 12/19/2015   SpO2 99%   BMI 27.83 kg/m           Thyroid nodule is palpable on the left middle lobe and is about 1.5 cm in size, this is smooth, firm Indistinct smaller nodule also felt in the left  Right lobe not enlarged and no nodules palpable.  There is no lymphadenopathy in the neck      Assessment/Plan:  Thyroid nodule on the left: She has had a benign thyroid nodule since 2008 Clinical exam shows about the same size of thyroid nodule compared to 2019  This on ultrasound from 2018 was measuring 1.3 cm and likely unchanged from 2008 Other nodules are relatively smaller in the thyroid  She had a minimal decrease in TSH in early 2019 but this is now consistently normal  She will follow-up  annually  Recommended follow-up with PCP for general medical evaluation and preventive care   Reather LittlerAjay Stephaniemarie Stoffel 04/07/2019

## 2019-04-24 ENCOUNTER — Other Ambulatory Visit: Payer: Self-pay

## 2019-04-24 ENCOUNTER — Ambulatory Visit
Admission: RE | Admit: 2019-04-24 | Discharge: 2019-04-24 | Disposition: A | Discharge status: Home / Self Care | Payer: Commercial Managed Care - PPO | Source: Ambulatory Visit | Attending: Internal Medicine | Admitting: Internal Medicine

## 2019-04-24 DIAGNOSIS — Z1231 Encounter for screening mammogram for malignant neoplasm of breast: Secondary | ICD-10-CM

## 2019-05-08 ENCOUNTER — Ambulatory Visit: Payer: Commercial Managed Care - PPO | Admitting: Internal Medicine

## 2019-05-08 ENCOUNTER — Ambulatory Visit (HOSPITAL_BASED_OUTPATIENT_CLINIC_OR_DEPARTMENT_OTHER)
Admission: RE | Admit: 2019-05-08 | Discharge: 2019-05-08 | Disposition: A | Discharge status: Home / Self Care | Payer: Commercial Managed Care - PPO | Source: Ambulatory Visit | Attending: Internal Medicine | Admitting: Internal Medicine

## 2019-05-08 ENCOUNTER — Encounter: Payer: Self-pay | Admitting: Internal Medicine

## 2019-05-08 ENCOUNTER — Other Ambulatory Visit: Payer: Self-pay

## 2019-05-08 VITALS — BP 123/75 | HR 62 | Temp 97.9°F | Resp 16 | Ht <= 58 in | Wt 130.5 lb

## 2019-05-08 DIAGNOSIS — Z23 Encounter for immunization: Secondary | ICD-10-CM | POA: Diagnosis not present

## 2019-05-08 DIAGNOSIS — M2550 Pain in unspecified joint: Secondary | ICD-10-CM

## 2019-05-08 NOTE — Patient Instructions (Signed)
Please get your x-rays at the first floor  See you next month for your physical exam

## 2019-05-08 NOTE — Progress Notes (Signed)
Subjective:    Patient ID: Cathy Kim, female    DOB: 26-Jul-1966, 52 y.o.   MRN: [id number]  DOS:  05/08/2019 Type of visit - description: Acute Her main concern is pain: Over the last several months is having pain in both knees, elbows, wrists, fingers. Occasional random pains at the right buttock when she walks. Her hands and feet feel stiff in the morning but typically sxs better after few minutes . Right pretibial lump?    Review of Systems No fever chills.  No weight loss. No rash No nausea, vomiting, diarrhea.  slightly blurry vision  lately, has an eye appointment soon. Admits to dry eyes, denies photophobia. Admits to vaginal dryness but no discharge or irritation. No dysuria or gross hematuria.  Past Medical History:  Diagnosis Date  . Thyroid nodule    benign FNA per patient    Past Surgical History:  Procedure Laterality Date  . OVARIAN CYST REMOVAL Left 1994    Social History   Socioeconomic History  . Marital status: Married    Spouse name: Annis Lagoy  . Number of children: 3  . Years of education: Not on file  . Highest education level: Not on file  Occupational History  . Occupation: Comptroller    Comment: [employer]  . Financial resource strain: Not hard at all  . Food insecurity    Worry: Never true    Inability: Never true  . Transportation needs    Medical: No    Non-medical: No  Tobacco Use  . Smoking status: Never Smoker  . Smokeless tobacco: Never Used  Substance and Sexual Activity  . Alcohol use: No    Alcohol/week: 0.0 standard drinks    Frequency: Never  . Drug use: No  . Sexual activity: Yes    Partners: Male  Lifestyle  . Physical activity    Days per week: 0 days    Minutes per session: Not on file  . Stress: Not at all  Relationships  . Social connections    Talks on phone: More than three times a week    Gets together: Once a week    Attends religious service: Never     Active member of club or organization: No    Attends meetings of clubs or organizations: Never    Relationship status: Married  . Intimate partner violence    Fear of current or ex partner: No    Emotionally abused: No    Physically abused: No    Forced sexual activity: No  Other Topics Concern  . Not on file  Social History Narrative   Original from [place]   Married, 3 children, 2 children at home      Allergies as of 05/08/2019   No Known Allergies     Medication List    as of May 08, 2019  4:01 PM   You have not been prescribed any medications.         Objective:   Physical Exam BP 123/75 (BP Location: Left Arm, Patient Position: Sitting, Cuff Size: Small)   Pulse 62   Temp 97.9 F (36.6 C) (Temporal)   Resp 16   Ht 4\' 9"  (1.448 m)   Wt 130 lb 8 oz (59.2 kg)   LMP 12/19/2015   SpO2 99%   BMI 28.24 kg/m  General:   Well developed, NAD, BMI noted. HEENT:  Normocephalic . Face symmetric, atraumatic Conjunctiva is: Minimal redness. Neck: No  thyromegaly Lungs:  CTA B Normal respiratory effort, no intercostal retractions, no accessory muscle use. Heart: RRR,  no murmur.  No pretibial edema bilaterally  Skin: Not pale. Not jaundice MSK: Hands, wrists without synovitis.  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 Knees: No effusion, mild bony changes consistent with DJD. Right pretibial area: Subcutaneous tissue is a slightly lumpy without clear-cut nodules or masses. Neurologic:  alert & oriented X3.  Speech normal, gait appropriate for age and unassisted Psych--  Cognition and judgment appear intact.  Cooperative with normal attention span and concentration.  Behavior appropriate. No anxious or depressed appearing.      Assessment      ASSESSMENT(new pt 09-2017)  Dysphagia: Saw GI 08-2017, Rx EGD.  EGD 3-25: Normal, dilated. 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  Menopausal 2018 Palpable aorta on exam 12/2017  Plan: Arthralgias: Patient concern about arthralgias  ?? 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  I do not suspect inflammatory arthritis on clinical grounds, she is coming next month for CPX and at the time   will do a sed rate, ANA, rheumatoid factor. We will get x-rays of the hands to rule out atypical changes. Otherwise okay to use sporadic Tylenol ibuprofen. Preventive care: Flu shot today, risk/benefits d/w pt

## 2019-05-08 NOTE — Progress Notes (Signed)
Pre visit review using our clinic review tool, if applicable. No additional management support is needed unless otherwise documented below in the visit note. 

## 2019-05-09 NOTE — Assessment & Plan Note (Signed)
Arthralgias: Patient concern about arthralgias  ?? 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  I do not suspect inflammatory arthritis on clinical grounds, she is coming next month for CPX and at the time   will do a sed rate, ANA, rheumatoid factor. We will get x-rays of the hands to rule out atypical changes. Otherwise okay to use sporadic Tylenol ibuprofen. Preventive care: Flu shot today, risk/benefits d/w pt

## 2019-05-12 ENCOUNTER — Other Ambulatory Visit: Payer: Self-pay

## 2019-05-13 ENCOUNTER — Encounter: Payer: Self-pay | Admitting: Internal Medicine

## 2019-05-13 ENCOUNTER — Encounter: Payer: Self-pay | Admitting: Obstetrics & Gynecology

## 2019-05-13 ENCOUNTER — Ambulatory Visit (INDEPENDENT_AMBULATORY_CARE_PROVIDER_SITE_OTHER): Payer: Commercial Managed Care - PPO | Admitting: Obstetrics & Gynecology

## 2019-05-13 VITALS — BP 138/86 | Ht 58.75 in | Wt 130.0 lb

## 2019-05-13 DIAGNOSIS — Z13820 Encounter for screening for osteoporosis: Secondary | ICD-10-CM | POA: Diagnosis not present

## 2019-05-13 DIAGNOSIS — Z01419 Encounter for gynecological examination (general) (routine) without abnormal findings: Secondary | ICD-10-CM | POA: Diagnosis not present

## 2019-05-13 DIAGNOSIS — Z780 Asymptomatic menopausal state: Secondary | ICD-10-CM | POA: Diagnosis not present

## 2019-05-13 NOTE — Progress Notes (Signed)
Kasarah Sitts [place] 1966/01/16 [id number]   History:    53 y.o. [id number] Married  RP:  Established patient presenting for annual gyn exam   HPI: Postmenopausal, well on no hormone replacement therapy.  No postmenopausal bleeding since October 2019 when an endometrial biopsy was done showing benign endometrium.  No pelvic pain.  No pain with intercourse.  Urine and bowel movements normal.  Breasts normal.  Body mass index 26.48.  Good fitness and healthy nutrition.  Will do health labs with family physician.  Past medical history,surgical history, family history and social history were all reviewed and documented in the EPIC chart.  Gynecologic History Patient's last menstrual period was 12/19/2015. Contraception: post menopausal status Last Pap: 12/2015. Results were: Negative/HPV HR negative Last mammogram: 04/2019. Results were: Negative Bone Density: Never, will schedule here now. Colonoscopy: Not yet, will schedule with Gastro  Obstetric History OB History  Gravida Para Term Preterm AB Living  4 3     1 3   SAB TAB Ectopic Multiple Live Births  1            # Outcome Date GA Lbr Len/2nd Weight Sex Delivery Anes PTL Lv  4 SAB           3 Para           2 Para           1 Para              ROS: A ROS was performed and pertinent positives and negatives are included in the history.  GENERAL: No fevers or chills. HEENT: No change in vision, no earache, sore throat or sinus congestion. NECK: No pain or stiffness. CARDIOVASCULAR: No chest pain or pressure. No palpitations. PULMONARY: No shortness of breath, cough or wheeze. GASTROINTESTINAL: No abdominal pain, nausea, vomiting or diarrhea, melena or bright red blood per rectum. GENITOURINARY: No urinary frequency, urgency, hesitancy or dysuria. MUSCULOSKELETAL: No joint or muscle pain, no back pain, no recent trauma. DERMATOLOGIC: No rash, no itching, no lesions. ENDOCRINE: No polyuria, polydipsia, no heat or cold intolerance.  No recent change in weight. HEMATOLOGICAL: No anemia or easy bruising or bleeding. NEUROLOGIC: No headache, seizures, numbness, tingling or weakness. PSYCHIATRIC: No depression, no loss of interest in normal activity or change in sleep pattern.     Exam:   BP 138/86   Ht 4' 10.75" (1.492 m)   Wt 130 lb (59 kg)   LMP 12/19/2015   BMI 26.48 kg/m   Body mass index is 26.48 kg/m.  General appearance : Well developed well nourished female. No acute distress HEENT: Eyes: no retinal hemorrhage or exudates,  Neck supple, trachea midline, no carotid bruits, no thyroidmegaly Lungs: Clear to auscultation, no rhonchi or wheezes, or rib retractions  Heart: Regular rate and rhythm, no murmurs or gallops Breast:Examined in sitting and supine position were symmetrical in appearance, no palpable masses or tenderness,  no skin retraction, no nipple inversion, no nipple discharge, no skin discoloration, no axillary or supraclavicular lymphadenopathy Abdomen: no palpable masses or tenderness, no rebound or guarding Extremities: no edema or skin discoloration or tenderness  Pelvic: Vulva: Normal             Vagina: No gross lesions or discharge  Cervix: No gross lesions or discharge.  Pap reflex done.  Uterus  AV, normal size, shape and consistency, non-tender and mobile  Adnexa  Without masses or tenderness  Anus: Normal   Assessment/Plan:  53 y.o.  female for annual exam   1. Encounter for routine gynecological examination with Papanicolaou smear of cervix Normal gynecologic exam.  Pap reflex done.  Breast exam normal.  Screening mammogram September 2020 was negative.  Will schedule colonoscopy with gastro.  Health labs with family physician.  Good body mass index at 26.48.  Continue with fitness and healthy nutrition.  2. Postmenopause Well on no hormone replacement therapy.  No postmenopausal bleeding.  3. Screening for osteoporosis Family history of osteoporosis and postmenopausal.  We will  proceed with a bone density here now.  Vitamin D supplements, calcium intake of 1200 mg daily and regular weightbearing physical activity is recommended. - DG Bone Density; Future  Princess Bruins MD, 3:42 PM 05/13/2019

## 2019-05-13 NOTE — Patient Instructions (Signed)
1. Encounter for routine gynecological examination with Papanicolaou smear of cervix Normal gynecologic exam.  Pap reflex done.  Breast exam normal.  Screening mammogram September 2020 was negative.  Will schedule colonoscopy with gastro.  Health labs with family physician.  Good body mass index at 26.48.  Continue with fitness and healthy nutrition.  2. Postmenopause Well on no hormone replacement therapy.  No postmenopausal bleeding.  3. Screening for osteoporosis Family history of osteoporosis and postmenopausal.  We will proceed with a bone density here now.  Vitamin D supplements, calcium intake of 1200 mg daily and regular weightbearing physical activity is recommended. - DG Bone Density; Future  Cathy Kim, it was a pleasure seeing you today!  I will inform you of your results as soon as they are available.

## 2019-05-18 LAB — PAP IG W/ RFLX HPV ASCU

## 2019-05-29 ENCOUNTER — Encounter: Payer: Commercial Managed Care - PPO | Admitting: Internal Medicine

## 2019-06-02 ENCOUNTER — Other Ambulatory Visit: Payer: Self-pay

## 2019-06-02 ENCOUNTER — Ambulatory Visit (AMBULATORY_SURGERY_CENTER): Payer: Self-pay | Admitting: *Deleted

## 2019-06-02 VITALS — Temp 97.3°F | Ht 58.75 in | Wt 133.0 lb

## 2019-06-02 DIAGNOSIS — Z1211 Encounter for screening for malignant neoplasm of colon: Secondary | ICD-10-CM

## 2019-06-02 MED ORDER — SUPREP BOWEL PREP KIT 17.5-3.13-1.6 GM/177ML PO SOLN: 1.0000 | Freq: Once | ORAL | 0 refills | Status: AC

## 2019-06-02 NOTE — Progress Notes (Signed)
No egg or soy allergy known to patient  No issues with past sedation with any surgeries  or procedures, no intubation problems  No diet pills per patient No home 02 use per patient  No blood thinners per patient  Pt denies issues with constipation  No A fib or A flutter  EMMI video sent to pt's e mail   Due to the COVID-19 pandemic we are asking patients to follow these guidelines. Please only bring one care partner. Please be aware that your care partner may wait in the car in the parking lot or if they feel like they will be too hot to wait in the car, they may wait in the lobby on the 4th floor. All care partners are required to wear a mask the entire time (we do not have any that we can provide them), they need to practice social distancing, and we will do a Covid check for all patient's and care partners when you arrive. Also we will check their temperature and your temperature. If the care partner waits in their car they need to stay in the parking lot the entire time and we will call them on their cell phone when the patient is ready for discharge so they can bring the car to the front of the building. Also all patient's will need to wear a mask into building. Suprep $15 coupon  

## 2019-06-03 ENCOUNTER — Encounter: Payer: Self-pay | Admitting: Internal Medicine

## 2019-06-08 ENCOUNTER — Telehealth: Payer: Self-pay

## 2019-06-08 NOTE — Telephone Encounter (Signed)
Covid-19 screening questions   Do you now or have you had a fever in the last 14 days?  Do you have any respiratory symptoms of shortness of breath or cough now or in the last 14 days?  Do you have any family members or close contacts with diagnosed or suspected Covid-19 in the past 14 days?  Have you been tested for Covid-19 and found to be positive?       

## 2019-06-09 ENCOUNTER — Encounter: Payer: Self-pay | Admitting: Internal Medicine

## 2019-06-09 ENCOUNTER — Encounter: Payer: Commercial Managed Care - PPO | Admitting: Internal Medicine

## 2019-06-09 ENCOUNTER — Ambulatory Visit (AMBULATORY_SURGERY_CENTER): Payer: Commercial Managed Care - PPO | Admitting: Internal Medicine

## 2019-06-09 ENCOUNTER — Other Ambulatory Visit: Payer: Self-pay

## 2019-06-09 VITALS — BP 116/69 | HR 66 | Temp 98.6°F | Resp 18 | Ht 59.0 in | Wt 133.0 lb

## 2019-06-09 DIAGNOSIS — Z1211 Encounter for screening for malignant neoplasm of colon: Secondary | ICD-10-CM

## 2019-06-09 MED ORDER — SODIUM CHLORIDE 0.9 % IV SOLN: 500.0000 mL | Freq: Once | INTRAVENOUS | Status: DC

## 2019-06-09 NOTE — Progress Notes (Signed)
JB- temp CW- Vitals 

## 2019-06-09 NOTE — Patient Instructions (Signed)
YOU HAD AN ENDOSCOPIC PROCEDURE TODAY AT THE [facility]:   Refer to the procedure report that was given to you for any specific questions about what was found during the examination.  If the procedure report does not answer your questions, please call your gastroenterologist to clarify.  If you requested that your care partner not be given the details of your procedure findings, then the procedure report has been included in a sealed envelope for you to review at your convenience later.  YOU SHOULD EXPECT: Some feelings of bloating in the abdomen. Passage of more gas than usual.  Walking can help get rid of the air that was put into your GI tract during the procedure and reduce the bloating. If you had a lower endoscopy (such as a colonoscopy or flexible sigmoidoscopy) you may notice spotting of blood in your stool or on the toilet paper. If you underwent a bowel prep for your procedure, you may not have a normal bowel movement for a few days.  Please Note:  You might notice some irritation and congestion in your nose or some drainage.  This is from the oxygen used during your procedure.  There is no need for concern and it should clear up in a day or so.  SYMPTOMS TO REPORT IMMEDIATELY:   Following lower endoscopy (colonoscopy or flexible sigmoidoscopy):  Excessive amounts of blood in the stool  Significant tenderness or worsening of abdominal pains  Swelling of the abdomen that is new, acute  Fever of 100F or higher   For urgent or emergent issues, a gastroenterologist can be reached at any hour by calling [phone].   DIET:  We do recommend a small meal at first, but then you may proceed to your regular diet.  Drink plenty of fluids but you should avoid alcoholic beverages for 24 hours.  MEDICATIONS: Continue present medications.  Please see handouts given to you by your recovery nurse.  ACTIVITY:  You should plan to take it easy for the rest of today and you should  NOT DRIVE or use heavy machinery until tomorrow (because of the sedation medicines used during the test).    FOLLOW UP: Our staff will call the number listed on your records 48-72 hours following your procedure to check on you and address any questions or concerns that you may have regarding the information given to you following your procedure. If we do not reach you, we will leave a message.  We will attempt to reach you two times.  During this call, we will ask if you have developed any symptoms of COVID 19. If you develop any symptoms (ie: fever, flu-like symptoms, shortness of breath, cough etc.) before then, please call [phone].  If you test positive for Covid 19 in the 2 weeks post procedure, please call and report this information to us.    If any biopsies were taken you will be contacted by phone or by letter within the next 1-3 weeks.  Please call us at [phone] if you have not heard about the biopsies in 3 weeks.   Thank you for allowing us to provide for your healthcare needs today.   SIGNATURES/CONFIDENTIALITY: You and/or your care partner have signed paperwork which will be entered into your electronic medical record.  These signatures attest to the fact that that the information above on your After Visit Summary has been reviewed and is understood.  Full responsibility of the confidentiality of this discharge information lies with you and/or   your care-partner. 

## 2019-06-09 NOTE — Progress Notes (Signed)
Pt's states no medical or surgical changes since previsit or office visit. 

## 2019-06-09 NOTE — Progress Notes (Signed)
Report to PACU, RN, vss, BBS= Clear.  

## 2019-06-09 NOTE — Op Note (Signed)
[facility] Patient Name: Cathy Kim Procedure Date: 06/09/2019 3:39 PM MRN: [id number] Endoscopist: Docia Chuck. Henrene Pastor , MD Age: 53 Referring MD:  Date of Birth: [birth year] Gender: Female Account #: [id number] Procedure:                Colonoscopy Indications:              Screening for colorectal malignant neoplasm Medicines:                Monitored Anesthesia Care Procedure:                Pre-Anesthesia Assessment:                           - Prior to the procedure, a History and Physical                            was performed, and patient medications and                            allergies were reviewed. The patient's tolerance of                            previous anesthesia was also reviewed. The risks                            and benefits of the procedure and the sedation                            options and risks were discussed with the patient.                            All questions were answered, and informed consent                            was obtained. Prior Anticoagulants: The patient has                            taken no previous anticoagulant or antiplatelet                            agents. ASA Grade Assessment: I - A normal, healthy                            patient. After reviewing the risks and benefits,                            the patient was deemed in satisfactory condition to                            undergo the procedure.                           After obtaining informed consent, the colonoscope  was passed under direct vision. Throughout the                            procedure, the patient's blood pressure, pulse, and                            oxygen saturations were monitored continuously. The                            Colonoscope was introduced through the anus and                            advanced to the the cecum, identified by                            appendiceal orifice and ileocecal  valve. The                            ileocecal valve, appendiceal orifice, and rectum                            were photographed. The quality of the bowel                            preparation was excellent. The colonoscopy was                            performed without difficulty. The patient tolerated                            the procedure well. The bowel preparation used was                            SUPREP via split dose instruction. Scope In: 3:44:29 PM Scope Out: 3:54:55 PM Scope Withdrawal Time: 0 hours 7 minutes 57 seconds  Total Procedure Duration: 0 hours 10 minutes 26 seconds  Findings:                 Multiple small-mouthed diverticula were found in                            the sigmoid colon.                           The exam was otherwise without abnormality on                            direct and retroflexion views. Complications:            No immediate complications. Estimated blood loss:                            None. Estimated Blood Loss:     Estimated blood loss: none. Impression:               - Diverticulosis in the sigmoid colon.                           -  The examination was otherwise normal on direct                            and retroflexion views.                           - No specimens collected. Recommendation:           - Repeat colonoscopy in 10 years for screening                            purposes.                           - Patient has a contact number available for                            emergencies. The signs and symptoms of potential                            delayed complications were discussed with the                            patient. Return to normal activities tomorrow.                            Written discharge instructions were provided to the                            patient.                           - Resume previous diet.                           - Continue present medications. Cathy Cathy N. Marina GoodellPerry, MD 06/09/2019  3:57:56 PM This report has been signed electronically.

## 2019-06-11 ENCOUNTER — Telehealth: Payer: Self-pay

## 2019-06-11 NOTE — Telephone Encounter (Signed)
  Follow up Call-  Call back number 06/09/2019 11/04/2017  Post procedure Call Back phone  # [phone]  Permission to leave phone message Yes Yes  Some recent data might be hidden     Patient questions:  Do you have a fever, pain , or abdominal swelling? No. Pain Score  0 *  Have you tolerated food without any problems? Yes.    Have you been able to return to your normal activities? Yes.    Do you have any questions about your discharge instructions: Diet   No. Medications  No. Follow up visit  No.  Do you have questions or concerns about your Care? No.  Actions: * If pain score is 4 or above: No action needed, pain <4. 1. Have you developed a fever since your procedure? no  2.   Have you had an respiratory symptoms (SOB or cough) since your procedure? no  3.   Have you tested positive for COVID 19 since your procedure no  4.   Have you had any family members/close contacts diagnosed with the COVID 19 since your procedure?  no   If yes to any of these questions please route to Joylene John, RN and Alphonsa Gin, Therapist, sports.

## 2019-06-23 ENCOUNTER — Encounter: Payer: Commercial Managed Care - PPO | Admitting: Internal Medicine

## 2019-07-02 ENCOUNTER — Encounter: Payer: Commercial Managed Care - PPO | Admitting: Internal Medicine

## 2019-10-29 ENCOUNTER — Other Ambulatory Visit: Payer: Self-pay

## 2019-10-29 ENCOUNTER — Other Ambulatory Visit (INDEPENDENT_AMBULATORY_CARE_PROVIDER_SITE_OTHER): Payer: Commercial Managed Care - PPO

## 2019-10-29 DIAGNOSIS — E041 Nontoxic single thyroid nodule: Secondary | ICD-10-CM

## 2019-10-29 LAB — T4, FREE: Free T4: 0.86 ng/dL (ref 0.60–1.60)

## 2019-10-29 LAB — TSH: TSH: 0.31 u[IU]/mL — ABNORMAL LOW (ref 0.35–4.50)

## 2019-11-02 ENCOUNTER — Ambulatory Visit (INDEPENDENT_AMBULATORY_CARE_PROVIDER_SITE_OTHER): Payer: Commercial Managed Care - PPO | Admitting: Endocrinology

## 2019-11-02 ENCOUNTER — Encounter: Payer: Self-pay | Admitting: Endocrinology

## 2019-11-02 ENCOUNTER — Other Ambulatory Visit: Payer: Self-pay

## 2019-11-02 VITALS — BP 108/68 | HR 77 | Ht 59.0 in | Wt 132.8 lb

## 2019-11-02 DIAGNOSIS — E041 Nontoxic single thyroid nodule: Secondary | ICD-10-CM

## 2019-11-02 DIAGNOSIS — R7989 Other specified abnormal findings of blood chemistry: Secondary | ICD-10-CM | POA: Diagnosis not present

## 2019-11-02 NOTE — Progress Notes (Signed)
Patient ID: Cathy Kim, female   DOB: 1965/08/22, 54 y.o.   MRN: [id number]            Reason for Appointment: Follow-up of thyroid nodule    History of Present Illness:   The patient's thyroid nodule was first discovered in 02/2007 on a routine exam The thyroid ultrasound report is not available However she had a needle aspiration done for her left-sided thyroid nodule which was reported as benign goiter   RECENT history   Patient has come in early for concerns about discomfort in her neck areas which is intermittent and mostly on the sides Sometimes will feel a pressure in the neck also The symptoms occur more when she is anxious or stressed Also occasionally will feel more anxious No heat intolerance or shakiness  She does not complain of any difficulty swallowing  No unusual fatigue  On her visit in 10/2017 she had a mildly decreased TSH level without increased T4 or T3 Subsequently TSH has been consistently normal although now it is low normal at 0.31 without change in free T4   Lab Results  Component Value Date   FREET4 0.86 10/29/2019   FREET4 0.78 04/02/2019   FREET4 0.72 01/03/2018   TSH 0.31 (L) 10/29/2019   TSH 0.40 04/02/2019   TSH 0.55 01/03/2018   Lab Results  Component Value Date   T3FREE 3.8 01/03/2018   T3FREE 3.4 11/11/2017   T3FREE 3.6 11/06/2017    THYROID nodule: He had a 1.5 cm left-sided nodule on her physical exam  Last ultrasound showed multiple small nodules, has 4 nodules over 1 cm Largest nodule 1.3 cm on the left inferior location and follow-up recommended on radiological characteristics   Cytology report from needle aspiration of the left-sided nodule in 07/2007: The specimen contains small to medium sized sheets of banal appearing follicular epithelial cells, some with oncocytic cell changes. In addition, in the background, there is abundantinflammation and scattered colloid. Diagnosis: Nonneoplastic  goiter  Allergies as of 11/02/2019   No Known Allergies     Medication List       Accurate as of November 02, 2019  2:12 PM. If you have any questions, ask your nurse or doctor.        STOP taking these medications   cholecalciferol 25 MCG (1000 UNIT) tablet Commonly known as: VITAMIN D3 Stopped by: Elayne Snare, MD     TAKE these medications   CENTRUM WOMEN PO Take by mouth.       Allergies: No Known Allergies  Past Medical History:  Diagnosis Date  . Thyroid nodule    benign FNA per patient     Past Surgical History:  Procedure Laterality Date  . OVARIAN CYST REMOVAL Left 1994  . UPPER GASTROINTESTINAL ENDOSCOPY      Family History  Problem Relation Age of Onset  . Diabetes Father   . Prostate cancer Father   . Osteoporosis Mother   . GER disease Mother   . Thyroid disease Paternal Aunt        Goiter  . Osteoarthritis Sister   . Colon cancer Neg Hx   . Liver cancer Neg Hx   . Stomach cancer Neg Hx   . Esophageal cancer Neg Hx   . Breast cancer Neg Hx   . Arthritis/Rheumatoid Neg Hx   . Colon polyps Neg Hx   . Rectal cancer Neg Hx     Social History:  reports that she has never smoked. She  has never used smokeless tobacco. She reports that she does not drink alcohol or use drugs.   Review of Systems:  No recent weight change  Wt Readings from Last 3 Encounters:  11/02/19 132 lb 12.8 oz (60.2 kg)  06/09/19 133 lb (60.3 kg)  06/02/19 133 lb (60.3 kg)      Examination:   BP 108/68 (BP Location: Left Arm, Patient Position: Sitting, Cuff Size: Normal)   Pulse 77   Ht 4\' 11"  (1.499 m)   Wt 132 lb 12.8 oz (60.2 kg)   LMP 12/19/2015   SpO2 98%   BMI 26.82 kg/m           Thyroid nodule is palpable on the left side and is about 1.5 cm in size, this is smooth, firm  Right lobe appears full but no nodule palpable No tenderness  There is no lymphadenopathy in the neck     Assessment/Plan:  Neck discomfort likely related to stress related  muscle spasm  Thyroid nodule on the left: She has had a benign thyroid nodule since 2008 Her nodule feels about the same on exam as on her last visit in 03/2019  She had a minimal decrease in TSH and this was explained to her Free T4 is quite normal Likely she has some autonomous areas in her multinodular goiter  She will discuss management of her anxiety and discomfort with her PCP if needed  Follow-up in 11/21    12/21 11/02/2019

## 2019-12-29 ENCOUNTER — Other Ambulatory Visit: Payer: Commercial Managed Care - PPO

## 2020-01-15 ENCOUNTER — Ambulatory Visit: Payer: Commercial Managed Care - PPO | Admitting: Internal Medicine

## 2020-02-01 ENCOUNTER — Telehealth: Payer: Self-pay | Admitting: Internal Medicine

## 2020-02-01 NOTE — Telephone Encounter (Signed)
Please advise patient: If she is having nausea, vomiting, abdominal pain and the stools are dark needs to go to the ER.  If she feels well and is just the stools that are different , okay to wait, will test her stools tomorrow.

## 2020-02-01 NOTE — Telephone Encounter (Signed)
Caller: Liviah Call back phone number: [phone]  Patient states she would like for her stools to be tested prior to her appointment tomorrow. Her stools have been black for the past couple of days.   Please Advise.

## 2020-02-01 NOTE — Telephone Encounter (Signed)
Patient was made aware of Dr. Drue Novel message. Patient understood.

## 2020-02-01 NOTE — Telephone Encounter (Signed)
Pt speaks spanish- can you let her know of below please?

## 2020-02-01 NOTE — Telephone Encounter (Signed)
Please advise 

## 2020-02-02 ENCOUNTER — Encounter: Payer: Self-pay | Admitting: Internal Medicine

## 2020-02-02 ENCOUNTER — Other Ambulatory Visit: Payer: Self-pay

## 2020-02-02 ENCOUNTER — Ambulatory Visit (INDEPENDENT_AMBULATORY_CARE_PROVIDER_SITE_OTHER): Payer: Commercial Managed Care - PPO | Admitting: Internal Medicine

## 2020-02-02 VITALS — BP 128/82 | HR 83 | Temp 98.4°F | Resp 16 | Ht 59.0 in | Wt 120.1 lb

## 2020-02-02 DIAGNOSIS — F329 Major depressive disorder, single episode, unspecified: Secondary | ICD-10-CM

## 2020-02-02 DIAGNOSIS — F419 Anxiety disorder, unspecified: Secondary | ICD-10-CM

## 2020-02-02 DIAGNOSIS — R197 Diarrhea, unspecified: Secondary | ICD-10-CM | POA: Diagnosis not present

## 2020-02-02 MED ORDER — ESCITALOPRAM OXALATE 10 MG PO TABS: 10.0000 mg | ORAL_TABLET | Freq: Every day | ORAL | 1 refills | Status: DC
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
## 2020-02-02 NOTE — Progress Notes (Signed)
Pre visit review using our clinic review tool, if applicable. No additional management support is needed unless otherwise documented below in the visit note. 

## 2020-02-02 NOTE — Patient Instructions (Signed)
Start Lexapro 10 mg 1 tablet at bedtime  See the counselor as already scheduled  Start Prilosec 20 mg over-the-counter 1 tablet before breakfast     GO TO THE LAB : Get the blood work     GO TO THE FRONT DESK, PLEASE SCHEDULE YOUR APPOINTMENTS Come back for a checkup in 4 weeks   Intimate Partner Violence Information Intimate partner violence, also called domestic abuse or relationship abuse, is a pattern of behaviors used by one partner to gain or maintain power and control over the other partner. Intimate partner violence can happen to women and men and can happen between people who are or were:  Married.  Dating.  Living together. What are the types of intimate partner violence? Intimate partner violence can involve physical, emotional, psychological, sexual, and economic abuse, or stalking by a current or former partner. Different types of abuse can occur at the same time within the same relationship.  Physical abuse. This includes rough handling, threats with a weapon, throwing objects, pushing, or hitting.  Emotional and psychological abuse. This includes verbal attacks, rejection, humiliation, intimidation, social isolation, or threats. Abuse may also include limiting contact with family and friends.  Sexual assault. Sexual assault is any unwanted sexual activity that occurs without clear permission (consent) from both people. This includes unwanted touching and sexual harassment.  Economic abuse. This includes controlling money, food, transportation, or other belongings.  Stalking. This involves such things as repeated, unwanted phone calls, e-mails, or text messages, or watching the victim from a distance. What are some warning signs of intimate partner violence? Physical signs  Bruises.  Broken bones.  Burns or cuts.  Physical pain.  Head injury. Emotional and psychological signs  Crying.  Depression.  Hopelessness.  Desperation.  Trouble  sleeping.  Fear of the partner.  Anxiety.  Suicidal thoughts or behavior.  Antisocial behavior.  Low self-esteem.  Fear of intimacy.  Flashbacks. Sexual signs  Bruising, swelling, or bleeding of the genital or rectal area.  Signs of an STI, such as genital sores, warts, or discharge coming from the genital area.  Pain in the genital area.  Unintended pregnancy.  Problems with pregnancy. What are common behaviors of those affected by intimate partner violence? Those affected by intimate partner violence may:  Be late to work or other events.  Not show up to places as promised.  Have to let their partner know where they are and who they are with.  Be isolated or kept from seeing friends or family.  Make comments about their partner's temper or behavior.  Make excuses for their partner.  Engage in high-risk sexual behaviors.  Use drugs or alcohol.  Have unhealthy eating behaviors. What are common feelings of those affected by intimate partner violence? Victims of intimate partner violence may feel that they:  Must be careful not to say or do things that trigger their partner's anger.  Cannot do anything right.  Deserve to be treated badly.  Overreact to their partner's behavior or temper.  Cannot trust their own feelings.  Cannot trust other people.  Are trapped.  May have their children taken away by their partner.  Are emotionally drained or numb.  Are in danger.  Might have to kill their partner to survive. Where can you get help? If you do not feel safe searching for help online at home, use a computer at a [employer] to access the Internet. Call 911 if you are in immediate danger or need medical help.  Intimate partner violence hotlines and websites  The [employer] Violence Hotline. ? 24-hour phone hotline: 6234391689 (SAFE) or 202-126-3978 (TTY). ? Videophone: available Monday through Friday, 9 a.m. to 5 p.m. Call  480-723-8563. ? thehotline.org  The National Sexual Assault Hotline. ? 24-hour phone hotline: 660 281 7054. ? safehelpline.org Shelters for victims of intimate partner violence If you are a victim of intimate partner violence, there are resources to help you find a temporary place for you and your children to live (shelter). The specific address of these shelters is often not known to the public. Police Report assaults, threats, and stalking to the police. Counselors and counseling centers  Counseling can help you cope with difficult emotions and empower you to plan for your future safety. The topics you discuss with a counselor are private and confidential. Children of intimate partner violence victims also might need counseling to manage stress and anxiety. The court system You can work with a Chief Executive Officer or an advocate to get legal protection against an abuser. Protection includes restraining orders and private addresses. Crimes against you, such as assault, can also be prosecuted through the courts. Laws vary by state. Follow these instructions at home:  Create a safety plan that includes ways to remain safe while you are in an abusive relationship, while you are planning to leave, or after you leave. This plan may be created by the victim alone or with assistance from the domestic violence hotline staff or local shelter staff. Your safety plan may include: ? How to cope with emotions. ? How to tell friends and family about the abuse. ? How to take legal action. ? How to create a safe home environment. ? How to keep your children safe. ? Emergency plans for life-threatening situations. Get help right away if you:  Feel like you are in immediate danger.  Feel like you may hurt yourself or others. If you ever feel like you may hurt yourself or others, or have thoughts about taking your own life, get help right away. You can go to your nearest emergency department or call:  Your local  emergency services (911 in the [place]  A suicide crisis helpline, such as the [employer] at 305-170-6601. This is open 24 hours a day. Summary  If you are a victim of intimate partner violence, there are resources to help you find a temporary place for you and your children to live (shelter).  Create a safety plan that includes ways to remain safe while you are in an abusive relationship, while you are planning to leave, or after you leave. This information is not intended to replace advice given to you by your health care provider. Make sure you discuss any questions you have with your health care provider. Document Revised: 01/06/2019 Document Reviewed: 09/13/2017 Elsevier Patient Education  [place].

## 2020-02-03 DIAGNOSIS — F32A Depression, unspecified: Secondary | ICD-10-CM | POA: Insufficient documentation

## 2020-02-03 LAB — CBC WITH DIFFERENTIAL/PLATELET
Basophils Absolute: 0 10*3/uL (ref 0.0–0.1)
Basophils Relative: 0.6 % (ref 0.0–3.0)
Eosinophils Absolute: 0 10*3/uL (ref 0.0–0.7)
Eosinophils Relative: 0.3 % (ref 0.0–5.0)
HCT: 38.4 % (ref 36.0–46.0)
Hemoglobin: 12.4 g/dL (ref 12.0–15.0)
Lymphocytes Relative: 29.1 % (ref 12.0–46.0)
Lymphs Abs: 2 10*3/uL (ref 0.7–4.0)
MCHC: 32.4 g/dL (ref 30.0–36.0)
MCV: 88.9 fl (ref 78.0–100.0)
Monocytes Absolute: 0.4 10*3/uL (ref 0.1–1.0)
Monocytes Relative: 6.4 % (ref 3.0–12.0)
Neutro Abs: 4.3 10*3/uL (ref 1.4–7.7)
Neutrophils Relative %: 63.6 % (ref 43.0–77.0)
Platelets: 223 10*3/uL (ref 150.0–400.0)
RBC: 4.32 Mil/uL (ref 3.87–5.11)
RDW: 18.7 % — ABNORMAL HIGH (ref 11.5–15.5)
WBC: 6.8 10*3/uL (ref 4.0–10.5)

## 2020-02-03 LAB — COMPREHENSIVE METABOLIC PANEL
ALT: 16 U/L (ref 0–35)
AST: 17 U/L (ref 0–37)
Albumin: 4.5 g/dL (ref 3.5–5.2)
Alkaline Phosphatase: 88 U/L (ref 39–117)
BUN: 10 mg/dL (ref 6–23)
CO2: 30 mEq/L (ref 19–32)
Calcium: 9.9 mg/dL (ref 8.4–10.5)
Chloride: 105 mEq/L (ref 96–112)
Creatinine, Ser: 0.75 mg/dL (ref 0.40–1.20)
GFR: 81.22 mL/min (ref >60.00)
Glucose, Bld: 111 mg/dL — ABNORMAL HIGH (ref 70–99)
Potassium: 3.9 mEq/L (ref 3.5–5.1)
Sodium: 141 mEq/L (ref 135–145)
Total Bilirubin: 0.4 mg/dL (ref 0.2–1.2)
Total Protein: 6.8 g/dL (ref 6.0–8.3)

## 2020-02-03 LAB — TSH: TSH: 0.54 u[IU]/mL (ref 0.35–4.50)
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
## 2020-03-01 ENCOUNTER — Other Ambulatory Visit: Payer: Self-pay

## 2020-03-01 ENCOUNTER — Encounter: Payer: Self-pay | Admitting: Internal Medicine

## 2020-03-01 ENCOUNTER — Ambulatory Visit: Payer: Commercial Managed Care - PPO | Admitting: Internal Medicine

## 2020-03-01 ENCOUNTER — Ambulatory Visit (HOSPITAL_BASED_OUTPATIENT_CLINIC_OR_DEPARTMENT_OTHER)
Admission: RE | Admit: 2020-03-01 | Discharge: 2020-03-01 | Disposition: A | Discharge status: Home / Self Care | Payer: Commercial Managed Care - PPO | Source: Ambulatory Visit | Attending: Internal Medicine | Admitting: Internal Medicine

## 2020-03-01 VITALS — BP 114/79 | HR 79 | Temp 98.7°F | Resp 16 | Ht 59.0 in | Wt 121.5 lb

## 2020-03-01 DIAGNOSIS — F419 Anxiety disorder, unspecified: Secondary | ICD-10-CM | POA: Diagnosis not present

## 2020-03-01 DIAGNOSIS — R05 Cough: Secondary | ICD-10-CM | POA: Diagnosis present

## 2020-03-01 DIAGNOSIS — F329 Major depressive disorder, single episode, unspecified: Secondary | ICD-10-CM

## 2020-03-01 DIAGNOSIS — R059 Cough, unspecified: Secondary | ICD-10-CM

## 2020-03-01 DIAGNOSIS — F32A Depression, unspecified: Secondary | ICD-10-CM

## 2020-03-01 NOTE — Progress Notes (Signed)
Subjective:    Patient ID: Cathy Kim, female    DOB: 04/28/1966, 54 y.o.   MRN: [id number]  DOS:  03/01/2020 Type of visit - description: Follow-up from previous visit Diarrhea: Resolved since the last visit. Anxiety depression: After the last visit, she had a long conversation with her husband and eventually they separated.  Since then she is feeling much better, increase self-esteem. Cough: Not better, was recommended PPIs but did not use.  Cough is dry, daily.    Review of Systems Denies fever chills Occasional postnasal dripping No wheezing No chest pain or difficulty breathing No heartburn per se.  Past Medical History:  Diagnosis Date  . Thyroid nodule    benign FNA per patient    Past Surgical History:  Procedure Laterality Date  . OVARIAN CYST REMOVAL Left 1994  . UPPER GASTROINTESTINAL ENDOSCOPY      Allergies as of 03/01/2020   No Known Allergies     Medication List       Accurate as of March 01, 2020 11:59 PM. If you have any questions, ask your nurse or doctor.        CENTRUM WOMEN PO Take by mouth.   escitalopram 10 MG tablet Commonly known as: Lexapro Take 1 tablet (10 mg total) by mouth at bedtime.          Objective:   Physical Exam BP 114/79 (BP Location: Left Arm, Patient Position: Sitting, Cuff Size: Small)   Pulse 79   Temp 98.7 F (37.1 C) (Oral)   Resp 16   Ht 4\' 11"  (1.499 m)   Wt 121 lb 8 oz (55.1 kg)   LMP 12/19/2015   SpO2 99%   BMI 24.54 kg/m  General:   Well developed, NAD, BMI noted. HEENT:  Normocephalic . Face symmetric, atraumatic Lungs:  CTA B Normal respiratory effort, no intercostal retractions, no accessory muscle use. Heart: RRR,  no murmur.  Lower extremities: no pretibial edema bilaterally  Skin: Not pale. Not jaundice Neurologic:  alert & oriented X3.  Speech normal, gait appropriate for age and unassisted Psych--  Cognition and judgment appear intact.  Cooperative with normal  attention span and concentration.  Behavior appropriate. No anxious or depressed appearing.      Assessment     ASSESSMENT   (new pt 09-2017)  Dysphagia: Saw GI 08-2017, Rx EGD.  EGD 3-25: Normal, dilated. 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 Menopausal 2018 Palpable aorta on exam 12/2017  Plan: See last visit Diarrhea: Labs normal, symptoms completely resolved. Anxiety depression: Since the last visit, she elected to separate from her husband, since then she feels great emotionally.  Still has difficult  days and she is taking Lexapro as needed.  She does feel safe at home. Plan: See a counselor for further counseling, contact numbers provided including a 01/2018. Recommend to either take daily  or do not take SSRIs, not for as needed use. Cough: Since the last visit, symptoms are about the same, she is concerned.  Did not try PPIs. ROS >> no history of asthma, some allergies with postnasal dripping, no heartburn per se Requests a  chest x-ray, will do. Additional recommend Flonase and omeprazole, see AVS.  If not better she will let me know. RTC CPX approximately 4 months.    This visit occurred during the SARS-CoV-2 public health emergency.  Safety protocols were in place, including screening questions prior to  the visit, additional usage of staff PPE, and extensive cleaning of exam room while observing appropriate contact time as indicated for disinfecting solutions.

## 2020-03-01 NOTE — Progress Notes (Signed)
Pre visit review using our clinic review tool, if applicable. No additional management support is needed unless otherwise documented below in the visit note. 

## 2020-03-01 NOTE — Patient Instructions (Addendum)
For cough: Flonase OTC: 2 sprays on each side of the nose daily  Omeprazole 20 mg 1 tablet every morning before breakfast  If the cough is not improving let me know  Please see a counselor.   GO TO THE FRONT DESK, PLEASE SCHEDULE YOUR APPOINTMENTS Come back for a physical exam in 4 months, fasting    STOP BY THE FIRST FLOOR:  get the XR

## 2020-03-02 NOTE — Assessment & Plan Note (Signed)
See last visit Diarrhea: Labs normal, symptoms completely resolved. Anxiety depression: Since the last visit, she elected to separate from her husband, since then she feels great emotionally.  Still has difficult  days and she is taking Lexapro as needed.  She does feel safe at home. Plan: See a counselor for further counseling, contact numbers provided including a Field seismologist. Recommend to either take daily  or do not take SSRIs, not for as needed use. Cough: Since the last visit, symptoms are about the same, she is concerned.  Did not try PPIs. ROS >> no history of asthma, some allergies with postnasal dripping, no heartburn per se Requests a  chest x-ray, will do. Additional recommend Flonase and omeprazole, see AVS.  If not better she will let me know. RTC CPX approximately 4 months.

## 2020-03-17 ENCOUNTER — Telehealth: Payer: Self-pay | Admitting: Internal Medicine

## 2020-03-17 MED ORDER — ESCITALOPRAM OXALATE 10 MG PO TABS: 10.0000 mg | ORAL_TABLET | Freq: Every day | ORAL | 3 refills | Status: DC

## 2020-03-17 NOTE — Telephone Encounter (Signed)
Refills have been sent. She needs an appt in November 2021. Please schedule at her earliest convenience. TY.

## 2020-03-17 NOTE — Telephone Encounter (Signed)
LVM for pt to call the office and schedule a fu appt for Nov 2021 with provider, also in VM was mentioned rx was sent to pharmacy.

## 2020-03-17 NOTE — Telephone Encounter (Signed)
Pt called requesting needing refill on escitalopram (LEXAPRO) 10 MG tablet sent to [facility] in [place]. Please advise.

## 2020-04-05 ENCOUNTER — Other Ambulatory Visit: Payer: Commercial Managed Care - PPO

## 2020-04-06 ENCOUNTER — Ambulatory Visit: Payer: Commercial Managed Care - PPO | Admitting: Endocrinology

## 2020-05-19 ENCOUNTER — Other Ambulatory Visit: Payer: Self-pay

## 2020-05-19 ENCOUNTER — Ambulatory Visit (INDEPENDENT_AMBULATORY_CARE_PROVIDER_SITE_OTHER): Payer: Commercial Managed Care - PPO | Admitting: Obstetrics & Gynecology

## 2020-05-19 ENCOUNTER — Encounter: Payer: Self-pay | Admitting: Obstetrics & Gynecology

## 2020-05-19 VITALS — BP 118/80 | Ht 59.0 in | Wt 123.0 lb

## 2020-05-19 DIAGNOSIS — N898 Other specified noninflammatory disorders of vagina: Secondary | ICD-10-CM

## 2020-05-19 DIAGNOSIS — Z780 Asymptomatic menopausal state: Secondary | ICD-10-CM

## 2020-05-19 DIAGNOSIS — Z01419 Encounter for gynecological examination (general) (routine) without abnormal findings: Secondary | ICD-10-CM

## 2020-05-19 MED ORDER — TINIDAZOLE 500 MG PO TABS
1000.0000 mg | ORAL_TABLET | Freq: Two times a day (BID) | ORAL | 0 refills | Status: AC
Start: 2020-05-19 — End: 2020-05-21

## 2020-05-19 NOTE — Progress Notes (Signed)
Cathy Kim Galva Reich 1966/01/25 [id number]   History:    54 y.o.  [id number] Separated  RP:  Established patient presenting for annual gyn exam   HPI: Postmenopausal, well on no hormone replacement therapy.  No PMB.  No pelvic pain.  C/O vaginal d/c with odor.  No pain with intercourse.  Urine and bowel movements normal.  Breasts normal.  Body mass index good at 24.84.  Good fitness and healthy nutrition.  Will do health labs with family physician.   Past medical history,surgical history, family history and social history were all reviewed and documented in the EPIC chart.  Gynecologic History Patient's last menstrual period was 12/19/2015.  Obstetric History OB History  Gravida Para Term Preterm AB Living  4 3     1 3   SAB TAB Ectopic Multiple Live Births  1            # Outcome Date GA Lbr Len/2nd Weight Sex Delivery Anes PTL Lv  4 SAB           3 Para           2 Para           1 Para              ROS: A ROS was performed and pertinent positives and negatives are included in the history.  GENERAL: No fevers or chills. HEENT: No change in vision, no earache, sore throat or sinus congestion. NECK: No pain or stiffness. CARDIOVASCULAR: No chest pain or pressure. No palpitations. PULMONARY: No shortness of breath, cough or wheeze. GASTROINTESTINAL: No abdominal pain, nausea, vomiting or diarrhea, melena or bright red blood per rectum. GENITOURINARY: No urinary frequency, urgency, hesitancy or dysuria. MUSCULOSKELETAL: No joint or muscle pain, no back pain, no recent trauma. DERMATOLOGIC: No rash, no itching, no lesions. ENDOCRINE: No polyuria, polydipsia, no heat or cold intolerance. No recent change in weight. HEMATOLOGICAL: No anemia or easy bruising or bleeding. NEUROLOGIC: No headache, seizures, numbness, tingling or weakness. PSYCHIATRIC: No depression, no loss of interest in normal activity or change in sleep pattern.     Exam:   BP 118/80 (BP Location: Right Arm,  Patient Position: Sitting, Cuff Size: Normal)   Ht 4\' 11"  (1.499 m)   Wt 123 lb (55.8 kg)   LMP 12/19/2015   BMI 24.84 kg/m   Body mass index is 24.84 kg/m.  General appearance : Well developed well nourished female. No acute distress HEENT: Eyes: no retinal hemorrhage or exudates,  Neck supple, trachea midline, no carotid bruits, no thyroidmegaly Lungs: Clear to auscultation, no rhonchi or wheezes, or rib retractions  Heart: Regular rate and rhythm, no murmurs or gallops Breast:Examined in sitting and supine position were symmetrical in appearance, no palpable masses or tenderness,  no skin retraction, no nipple inversion, no nipple discharge, no skin discoloration, no axillary or supraclavicular lymphadenopathy Abdomen: no palpable masses or tenderness, no rebound or guarding Extremities: no edema or skin discoloration or tenderness  Pelvic: Vulva: Normal             Vagina: No gross lesions or discharge.  Wet prep done.  Cervix: No gross lesions or discharge  Uterus  AV, normal size, shape and consistency, non-tender and mobile  Adnexa  Without masses or tenderness  Anus: Normal  Wet prep:  Many bacteria   Assessment/Plan:  55 y.o. female for annual exam   1. Well female exam with routine gynecological exam Normal gynecologic exam in menopause.  Pap test September 2020 was negative, no indication to repeat this year.  Breast exam normal.  Will schedule a screening mammogram now.  Colonoscopy October 2020.  Health labs with family physician.  Very good body mass index at 24.84.  At first, lost weight going through a separation from her husband, but now doing fitness activities and has a healthy nutrition.  2. Postmenopause Post menopause, well on no hormone replacement therapy.  No postmenopausal bleeding.  Recommend vitamin D supplements, calcium intake of 1500 mg daily and regular weightbearing physical activities.  3. Vaginal odor Wet prep showing many bacteria but no clue  cells.  Decision to treat nonetheless with tinidazole.  No contraindication, usage reviewed, prescription sent to pharmacy. - WET PREP FOR TRICH, YEAST, CLUE  Other orders - Cholecalciferol (VITAMIN D) 50 MCG (2000 UT) tablet; Take 1 Units by mouth daily. - Ascorbic Acid (VITAMIN C PO); Take 1 tablet by mouth daily. - tinidazole (TINDAMAX) 500 MG tablet; Take 2 tablets (1,000 mg total) by mouth 2 (two) times daily for 2 days.  Genia Del MD, 4:24 PM 05/19/2020

## 2020-05-20 LAB — WET PREP FOR TRICH, YEAST, CLUE

## 2020-05-27 ENCOUNTER — Other Ambulatory Visit: Payer: Self-pay | Admitting: Internal Medicine

## 2020-05-27 DIAGNOSIS — Z1231 Encounter for screening mammogram for malignant neoplasm of breast: Secondary | ICD-10-CM

## 2020-06-22 ENCOUNTER — Ambulatory Visit: Payer: Commercial Managed Care - PPO | Admitting: Internal Medicine

## 2020-06-22 DIAGNOSIS — Z0289 Encounter for other administrative examinations: Secondary | ICD-10-CM

## 2020-06-23 ENCOUNTER — Other Ambulatory Visit: Payer: Self-pay

## 2020-06-23 ENCOUNTER — Ambulatory Visit
Admission: RE | Admit: 2020-06-23 | Discharge: 2020-06-23 | Disposition: A | Discharge status: Home / Self Care | Payer: Commercial Managed Care - PPO | Source: Ambulatory Visit | Attending: Internal Medicine | Admitting: Internal Medicine

## 2020-06-23 DIAGNOSIS — Z1231 Encounter for screening mammogram for malignant neoplasm of breast: Secondary | ICD-10-CM

## 2020-07-01 ENCOUNTER — Other Ambulatory Visit: Payer: Self-pay

## 2020-07-01 ENCOUNTER — Ambulatory Visit: Payer: Commercial Managed Care - PPO | Admitting: Internal Medicine

## 2020-07-01 ENCOUNTER — Encounter: Payer: Self-pay | Admitting: Internal Medicine

## 2020-07-01 VITALS — BP 129/82 | HR 61 | Temp 98.0°F | Ht 59.0 in | Wt 123.2 lb

## 2020-07-01 DIAGNOSIS — E559 Vitamin D deficiency, unspecified: Secondary | ICD-10-CM | POA: Diagnosis not present

## 2020-07-01 DIAGNOSIS — Z23 Encounter for immunization: Secondary | ICD-10-CM

## 2020-07-01 DIAGNOSIS — Z8262 Family history of osteoporosis: Secondary | ICD-10-CM

## 2020-07-01 DIAGNOSIS — Z0000 Encounter for general adult medical examination without abnormal findings: Secondary | ICD-10-CM | POA: Diagnosis not present

## 2020-07-01 DIAGNOSIS — Z780 Asymptomatic menopausal state: Secondary | ICD-10-CM

## 2020-07-01 DIAGNOSIS — R079 Chest pain, unspecified: Secondary | ICD-10-CM

## 2020-07-01 NOTE — Progress Notes (Signed)
Subjective:    Patient ID: Cathy Kim, female    DOB: 07-07-1966, 54 y.o.   MRN: [id number]  DOS:  07/01/2020 Type of visit - description: CPX  She has a couple of concerns: Few months history of hoarseness, denies sneezing, sinus pain, sinus discharge.  No heartburn  Also last week for 3 days in a row reports left-sided chest pain: With no radiation, no chest wall TTP, at rest, not at work, pain  lasted few hours, no associated symptoms including no rash. Symptoms are largely resolved  Review of Systems  Other than above, a 14 point review of systems is negative       Past Medical History:  Diagnosis Date  . Thyroid nodule    benign FNA per patient    Past Surgical History:  Procedure Laterality Date  . OVARIAN CYST REMOVAL Left 1994  . UPPER GASTROINTESTINAL ENDOSCOPY      Allergies as of 07/01/2020   No Known Allergies     Medication List       Accurate as of July 01, 2020 11:59 PM. If you have any questions, ask your nurse or doctor.        CENTRUM WOMEN PO Take by mouth.   VITAMIN C PO Take 1 tablet by mouth daily.   Vitamin D 50 MCG (2000 UT) tablet Take 1 Units by mouth daily.          Objective:   Physical Exam BP 129/82 (BP Location: Right Arm, Patient Position: Sitting, Cuff Size: Large)   Pulse 61   Temp 98 F (36.7 C) (Oral)   Ht 4\' 11"  (1.499 m)   Wt 123 lb 3.2 oz (55.9 kg)   LMP 12/19/2015   SpO2 100%   BMI 24.88 kg/m  General: Well developed, NAD, BMI noted Neck: No lymphadenopathy HEENT:  Normocephalic . Face symmetric, atraumatic.  No hoarseness noted Lungs:  CTA B Normal respiratory effort, no intercostal retractions, no accessory muscle use. Heart: RRR,  no murmur.  Abdomen:  Not distended, soft, non-tender. No rebound or rigidity.   Lower extremities: no pretibial edema bilaterally  Skin: Exposed areas without rash. Not pale. Not jaundice Neurologic:  alert & oriented X3.  Speech normal, gait  appropriate for age and unassisted Strength symmetric and appropriate for age.  Psych: Cognition and judgment appear intact.  Cooperative with normal attention span and concentration.  Behavior appropriate. No anxious or depressed appearing.     Assessment     ASSESSMENT   (new pt 09-2017)  Dysphagia: Saw GI 08-2017, Rx EGD.  EGD 3-25: Normal, dilated. 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 Menopausal 2018 Palpable aorta on exam 12/2017 Vitamin D deficiency  PLAN Thyroid nodules: Follow-up by Endo. Chest pain: As described above, not cardiac in nature, for complete list check a chest x-ray Hoarseness: She is 54, never a smoker, she has thyroid nodule but no lymphadenopathies.  No GERD sxs.  Voice seems normal today.  We agreed on observation, if she is not better in the next couple of months she will let me know for a ENT referral.  Will make Endo aware via message. Social: Still trying to get separated from husband, emotionally doing okay. RTC 1 year   This visit occurred during the SARS-CoV-2 public health emergency.  Safety protocols were in place, including screening questions prior to the visit, additional usage of staff PPE, and extensive cleaning of exam  room while observing appropriate contact time as indicated for disinfecting solutions.

## 2020-07-01 NOTE — Patient Instructions (Addendum)
     GO TO THE FRONT DESK, PLEASE SCHEDULE YOUR APPOINTMENTS Come back for   blood work next week, fasting please make an appointment.  Come back for a physical exam in 1 year   STOP BY THE FIRST FLOOR: Get the chest x-ray Schedule a bone density test

## 2020-07-03 ENCOUNTER — Encounter: Payer: Self-pay | Admitting: Internal Medicine

## 2020-07-03 NOTE — Assessment & Plan Note (Signed)
Thyroid nodules: Follow-up by Endo. Chest pain: As described above, not cardiac in nature, for complete list check a chest x-ray Hoarseness: She is 54, never a smoker, she has thyroid nodule but no lymphadenopathies.  No GERD sxs.  Voice seems normal today.  We agreed on observation, if she is not better in the next couple of months she will let me know for a ENT referral.  Will make Endo aware via message. Social: Still trying to get separated from husband, emotionally doing okay. RTC 1 year

## 2020-07-03 NOTE — Assessment & Plan Note (Signed)
Td 2012 (per pt) -covid vax: x 2, booster rec - flu shot: today  - female care, saw gyn ~ 2-3 weeks , [facility] 06-2020 - menopausal : on Vit D, rec Ca; +FH osteoporosis: order DEXA ; no h/o personal FXs -CCS: Cscope 05/2019 no polyps, 10 years  Labs: CMP, FLP, A1c, vitamin D,

## 2020-07-05 ENCOUNTER — Other Ambulatory Visit (INDEPENDENT_AMBULATORY_CARE_PROVIDER_SITE_OTHER): Payer: Commercial Managed Care - PPO

## 2020-07-05 ENCOUNTER — Other Ambulatory Visit: Payer: Self-pay

## 2020-07-05 DIAGNOSIS — E041 Nontoxic single thyroid nodule: Secondary | ICD-10-CM

## 2020-07-05 DIAGNOSIS — R7989 Other specified abnormal findings of blood chemistry: Secondary | ICD-10-CM

## 2020-07-05 LAB — TSH: TSH: 0.18 u[IU]/mL — ABNORMAL LOW (ref 0.35–4.50)

## 2020-07-05 LAB — T4, FREE: Free T4: 0.81 ng/dL (ref 0.60–1.60)

## 2020-07-05 LAB — T3, FREE: T3, Free: 3.3 pg/mL (ref 2.3–4.2)

## 2020-07-08 ENCOUNTER — Ambulatory Visit: Payer: Commercial Managed Care - PPO | Admitting: Endocrinology

## 2020-07-12 ENCOUNTER — Other Ambulatory Visit: Payer: Commercial Managed Care - PPO

## 2020-07-13 ENCOUNTER — Ambulatory Visit (INDEPENDENT_AMBULATORY_CARE_PROVIDER_SITE_OTHER): Payer: Commercial Managed Care - PPO | Admitting: Endocrinology

## 2020-07-13 ENCOUNTER — Encounter: Payer: Self-pay | Admitting: Endocrinology

## 2020-07-13 ENCOUNTER — Other Ambulatory Visit: Payer: Self-pay

## 2020-07-13 VITALS — BP 122/78 | HR 78 | Resp 97 | Ht 59.0 in | Wt 127.0 lb

## 2020-07-13 DIAGNOSIS — R490 Dysphonia: Secondary | ICD-10-CM | POA: Diagnosis not present

## 2020-07-13 DIAGNOSIS — E042 Nontoxic multinodular goiter: Secondary | ICD-10-CM | POA: Diagnosis not present

## 2020-07-13 DIAGNOSIS — R7989 Other specified abnormal findings of blood chemistry: Secondary | ICD-10-CM

## 2020-07-13 NOTE — Progress Notes (Signed)
Patient ID: Cathy Kim, female   DOB: 04-17-1966, 54 y.o.   MRN: [id number]            Reason for Appointment: Follow-up of thyroid nodule    History of Present Illness:   The patient's thyroid nodule was first discovered in 02/2007 on a routine exam The thyroid ultrasound report is not available However she had a needle aspiration done for her left-sided thyroid nodule which was reported as benign goiter   RECENT history   She is back for her scheduled follow-up She is also concerned about her voice sometimes getting deeper hoarse and changing She has discussed this with PCP She is still having some discomfort in her upper neck especially on the right side with some pressure but this is mostly when she is anxious  No complaints of shakiness or palpitations  No heat intolerance or shakiness, no fatigue  Periodically on her labs she has had a mildly decreased TSH level without increased T4 or T3 In 6/21 TSH came back to normal Again now it is low normal at 0.18 without change in free T4 or free T3   Lab Results  Component Value Date   FREET4 0.81 07/05/2020   FREET4 0.86 10/29/2019   FREET4 0.78 04/02/2019   TSH 0.18 (L) 07/05/2020   TSH 0.54 02/02/2020   TSH 0.31 (L) 10/29/2019   Lab Results  Component Value Date   T3FREE 3.3 07/05/2020   T3FREE 3.8 01/03/2018   T3FREE 3.4 11/11/2017    THYROID nodule:  had a 1.5 cm left-sided nodule on her physical exam  Last ultrasound showed multiple small nodules, has 4 nodules over 1 cm Largest nodule 1.3 cm on the left inferior location and follow-up recommended on radiological characteristics   Cytology report from needle aspiration of the left-sided nodule in 07/2007: The specimen contains small to medium sized sheets of banal appearing follicular epithelial cells, some with oncocytic cell changes. In addition, in the background, there is abundantinflammation and scattered colloid. Diagnosis:  Nonneoplastic goiter  Allergies as of 07/13/2020   No Known Allergies     Medication List       Accurate as of July 13, 2020  4:23 PM. If you have any questions, ask your nurse or doctor.        CENTRUM WOMEN PO Take by mouth.   VITAMIN C PO Take 1 tablet by mouth daily.   Vitamin D 50 MCG (2000 UT) tablet Take 1 Units by mouth daily.       Allergies: No Known Allergies  Past Medical History:  Diagnosis Date  . Thyroid nodule    benign FNA per patient     Past Surgical History:  Procedure Laterality Date  . OVARIAN CYST REMOVAL Left 1994  . UPPER GASTROINTESTINAL ENDOSCOPY      Family History  Problem Relation Age of Onset  . Diabetes Father   . Prostate cancer Father   . Osteoporosis Mother   . GER disease Mother   . Thyroid disease Paternal Aunt        Goiter  . Osteoarthritis Sister   . Colon cancer Neg Hx   . Liver cancer Neg Hx   . Stomach cancer Neg Hx   . Esophageal cancer Neg Hx   . Breast cancer Neg Hx   . Arthritis/Rheumatoid Neg Hx   . Colon polyps Neg Hx   . Rectal cancer Neg Hx     Social History:  reports that she has  never smoked. She has never used smokeless tobacco. She reports that she does not drink alcohol and does not use drugs.   Review of Systems:  She has had mild fluctuation in  Wt Readings from Last 3 Encounters:  07/13/20 127 lb (57.6 kg)  07/01/20 123 lb 3.2 oz (55.9 kg)  05/19/20 123 lb (55.8 kg)     Examination:   BP 122/78   Pulse 78   Resp (!) 97   Ht 4\' 11"  (1.499 m)   Wt 127 lb (57.6 kg)   LMP 12/19/2015   BMI 25.65 kg/m           Thyroid nodule is palpable on the lower medial part of the left lobe and is about 1.5 cm in size, this is smooth, firm Smaller indistinct nodule felt on the medial upper part also  Right lobe is not enlarged and no nodules palpable   There is no lymphadenopathy in the neck   No tremor present.  Biceps reflexes appear normal  Assessment/Plan:  History of voice  change/hoarseness: This is not related to any change in her thyroid function. No change apparently in the thyroid nodules that she has had long-term Likely needs ENT evaluation for vocal cord dysfunction  Thyroid nodule on the left: She has had a benign thyroid nodule since 2008 Her nodule on exam is unchanged from before  She has had an inconsistent decrease in her TSH level Although her TSH is only 0.18 her free T4 and T3 are normal Likely has a warm nodule or autonomous areas in her goiter  This will be evaluated further with thyroid scan to establish a diagnosis for future reference Explained in detail what abnormal thyroid levels indicate and potential for radioactive iodine treatment if she develops a hot nodule with overactive thyroid  She will discuss management of her voice changes with her PCP and likely needs ENT referral  Follow-up in 6 months    Lalla Laham 2009 07/13/2020

## 2020-07-14 ENCOUNTER — Other Ambulatory Visit: Payer: Self-pay

## 2020-07-14 ENCOUNTER — Telehealth: Payer: Self-pay | Admitting: Internal Medicine

## 2020-07-14 DIAGNOSIS — R490 Dysphonia: Secondary | ICD-10-CM

## 2020-07-14 NOTE — Telephone Encounter (Signed)
Endocrinology recommend ENT referral for hoarseness which is not related with goiter. Please enter a referral and let the patient know.

## 2020-07-14 NOTE — Telephone Encounter (Signed)
Referral has been placed, Left VM message for patient to advised to return call to office.

## 2020-07-14 NOTE — Telephone Encounter (Signed)
Called pt and advised pt understood and confirmed.

## 2020-09-05 ENCOUNTER — Encounter (HOSPITAL_COMMUNITY): Payer: Commercial Managed Care - PPO

## 2020-09-05 ENCOUNTER — Encounter (HOSPITAL_COMMUNITY): Admission: RE | Admit: 2020-09-05 | Payer: Commercial Managed Care - PPO | Source: Ambulatory Visit

## 2020-09-06 ENCOUNTER — Encounter (HOSPITAL_COMMUNITY): Payer: Commercial Managed Care - PPO

## 2020-10-31 ENCOUNTER — Encounter: Payer: Self-pay | Admitting: Internal Medicine

## 2021-01-05 ENCOUNTER — Other Ambulatory Visit (INDEPENDENT_AMBULATORY_CARE_PROVIDER_SITE_OTHER): Payer: Commercial Managed Care - PPO

## 2021-01-05 ENCOUNTER — Other Ambulatory Visit: Payer: Self-pay

## 2021-01-05 DIAGNOSIS — E042 Nontoxic multinodular goiter: Secondary | ICD-10-CM

## 2021-01-05 DIAGNOSIS — R7989 Other specified abnormal findings of blood chemistry: Secondary | ICD-10-CM | POA: Diagnosis not present

## 2021-01-05 LAB — TSH: TSH: 0.49 u[IU]/mL (ref 0.35–4.50)

## 2021-01-05 LAB — T4, FREE: Free T4: 0.66 ng/dL (ref 0.60–1.60)

## 2021-01-05 LAB — T3, FREE: T3, Free: 3.7 pg/mL (ref 2.3–4.2)

## 2021-01-12 ENCOUNTER — Ambulatory Visit: Payer: Commercial Managed Care - PPO | Admitting: Endocrinology

## 2021-01-20 ENCOUNTER — Ambulatory Visit: Payer: Commercial Managed Care - PPO | Admitting: Internal Medicine

## 2021-01-24 ENCOUNTER — Other Ambulatory Visit: Payer: Self-pay

## 2021-01-24 ENCOUNTER — Ambulatory Visit (HOSPITAL_BASED_OUTPATIENT_CLINIC_OR_DEPARTMENT_OTHER)
Admission: RE | Admit: 2021-01-24 | Discharge: 2021-01-24 | Disposition: A | Discharge status: Home / Self Care | Payer: Commercial Managed Care - PPO | Source: Ambulatory Visit | Attending: Internal Medicine | Admitting: Internal Medicine

## 2021-01-24 DIAGNOSIS — Z13820 Encounter for screening for osteoporosis: Secondary | ICD-10-CM | POA: Insufficient documentation

## 2021-01-24 DIAGNOSIS — Z8262 Family history of osteoporosis: Secondary | ICD-10-CM | POA: Diagnosis present

## 2021-01-24 DIAGNOSIS — Z780 Asymptomatic menopausal state: Secondary | ICD-10-CM | POA: Insufficient documentation

## 2021-01-24 DIAGNOSIS — E559 Vitamin D deficiency, unspecified: Secondary | ICD-10-CM | POA: Diagnosis not present

## 2021-01-24 DIAGNOSIS — M8589 Other specified disorders of bone density and structure, multiple sites: Secondary | ICD-10-CM | POA: Insufficient documentation

## 2021-01-26 ENCOUNTER — Ambulatory Visit (INDEPENDENT_AMBULATORY_CARE_PROVIDER_SITE_OTHER): Payer: Commercial Managed Care - PPO | Admitting: Endocrinology

## 2021-01-26 ENCOUNTER — Other Ambulatory Visit: Payer: Self-pay

## 2021-01-26 VITALS — BP 112/76 | HR 70 | Ht 59.75 in | Wt 123.2 lb

## 2021-01-26 DIAGNOSIS — E041 Nontoxic single thyroid nodule: Secondary | ICD-10-CM

## 2021-01-26 NOTE — Progress Notes (Signed)
Patient ID: Cathy Kim, female   DOB: Feb 27, 1966, 55 y.o.   MRN: [id number]            Reason for Appointment: Follow-up of thyroid nodule    History of Present Illness:   The patient's thyroid nodule was first discovered in 02/2007 on a routine exam The thyroid ultrasound report is not available However she had a needle aspiration done for her left-sided thyroid nodule which was reported as benign goiter   RECENT history   She is back for her scheduled follow-up She was last seen in 12/21  No complaints of shakiness or palpitations No change in her weight lately Her previous difficulties with voice changes has improved with less stress No neck discomfort or difficulty swallowing  Periodically on her labs she has had a mildly decreased TSH level without increased T4 or T3 In 11/21 TSH was again low normal at 0.18 without change in free T4 or free T3  This is back to normal again  Lab Results  Component Value Date   FREET4 0.66 01/05/2021   FREET4 0.81 07/05/2020   FREET4 0.86 10/29/2019   TSH 0.49 01/05/2021   TSH 0.18 (L) 07/05/2020   TSH 0.54 02/02/2020   Lab Results  Component Value Date   T3FREE 3.7 01/05/2021   T3FREE 3.3 07/05/2020   T3FREE 3.8 01/03/2018    THYROID nodule:  had a 1.5 cm left-sided nodule on her physical exam  Last ultrasound showed multiple small nodules, has 4 nodules over 1 cm Largest nodule 1.3 cm on the left inferior location and follow-up recommended on radiological characteristics   Cytology report from needle aspiration of the left-sided nodule in 07/2007:  The specimen contains small to medium sized sheets of banal appearing follicular epithelial cells, some with oncocytic cell  changes. In addition, in the background, there is abundant inflammation and scattered colloid. Diagnosis: Nonneoplastic goiter  Allergies as of 01/26/2021   No Known Allergies      Medication List        Accurate as of January 26, 2021   1:30 PM. If you have any questions, ask your nurse or doctor.          CENTRUM WOMEN PO Take by mouth.   VITAMIN C PO Take 1 tablet by mouth daily.   Vitamin D 50 MCG (2000 UT) tablet Take 1 Units by mouth daily.        Allergies: No Known Allergies  Past Medical History:  Diagnosis Date   Thyroid nodule    benign FNA per patient     Past Surgical History:  Procedure Laterality Date   OVARIAN CYST REMOVAL Left 1994   UPPER GASTROINTESTINAL ENDOSCOPY      Family History  Problem Relation Age of Onset   Diabetes Father    Prostate cancer Father    Osteoporosis Mother    GER disease Mother    Thyroid disease Paternal Aunt        Goiter   Osteoarthritis Sister    Colon cancer Neg Hx    Liver cancer Neg Hx    Stomach cancer Neg Hx    Esophageal cancer Neg Hx    Breast cancer Neg Hx    Arthritis/Rheumatoid Neg Hx    Colon polyps Neg Hx    Rectal cancer Neg Hx     Social History:  reports that she has never smoked. She has never used smokeless tobacco. She reports that she does not drink alcohol and  does not use drugs.   Review of Systems:  She has had mild fluctuation in  Wt Readings from Last 3 Encounters:  01/26/21 123 lb 3.2 oz (55.9 kg)  07/13/20 127 lb (57.6 kg)  07/01/20 123 lb 3.2 oz (55.9 kg)     Examination:   BP 112/76   Pulse 70   Ht 4' 11.75" (1.518 m)   Wt 123 lb 3.2 oz (55.9 kg)   LMP 12/19/2015   SpO2 98%   BMI 24.26 kg/m           Thyroid nodule is palpable on the lower medial part of the left lobe, about 1.5 cm in size, this is smooth, firm Smaller nodule felt on the medial upper part also  Right lobe is not normal   There is no lymphadenopathy in the neck   Biceps reflexes appear normal  Assessment/Plan:   Thyroid nodule on the left: She has had a benign thyroid nodule since 2008 The nodule on exam is unchanged from before  She has had an inconsistent decrease in her TSH level She was scheduled for a thyroid  scan which she did not do last December  Since thyroid levels are back to normal thyroid scan is not indicated Reassured her that no further evaluation is needed at this time  Follow-up in 6 months    Cathy Kim 01/26/2021

## 2021-05-23 IMAGING — DX DG HAND COMPLETE 3+V*L*
3 series · 3 of 3 positions shown · non-contrast
Comparison: None.

CLINICAL DATA: Arthralgias.  Pain.

EXAM:
LEFT HAND - COMPLETE 3+ VIEW

[hand pa]
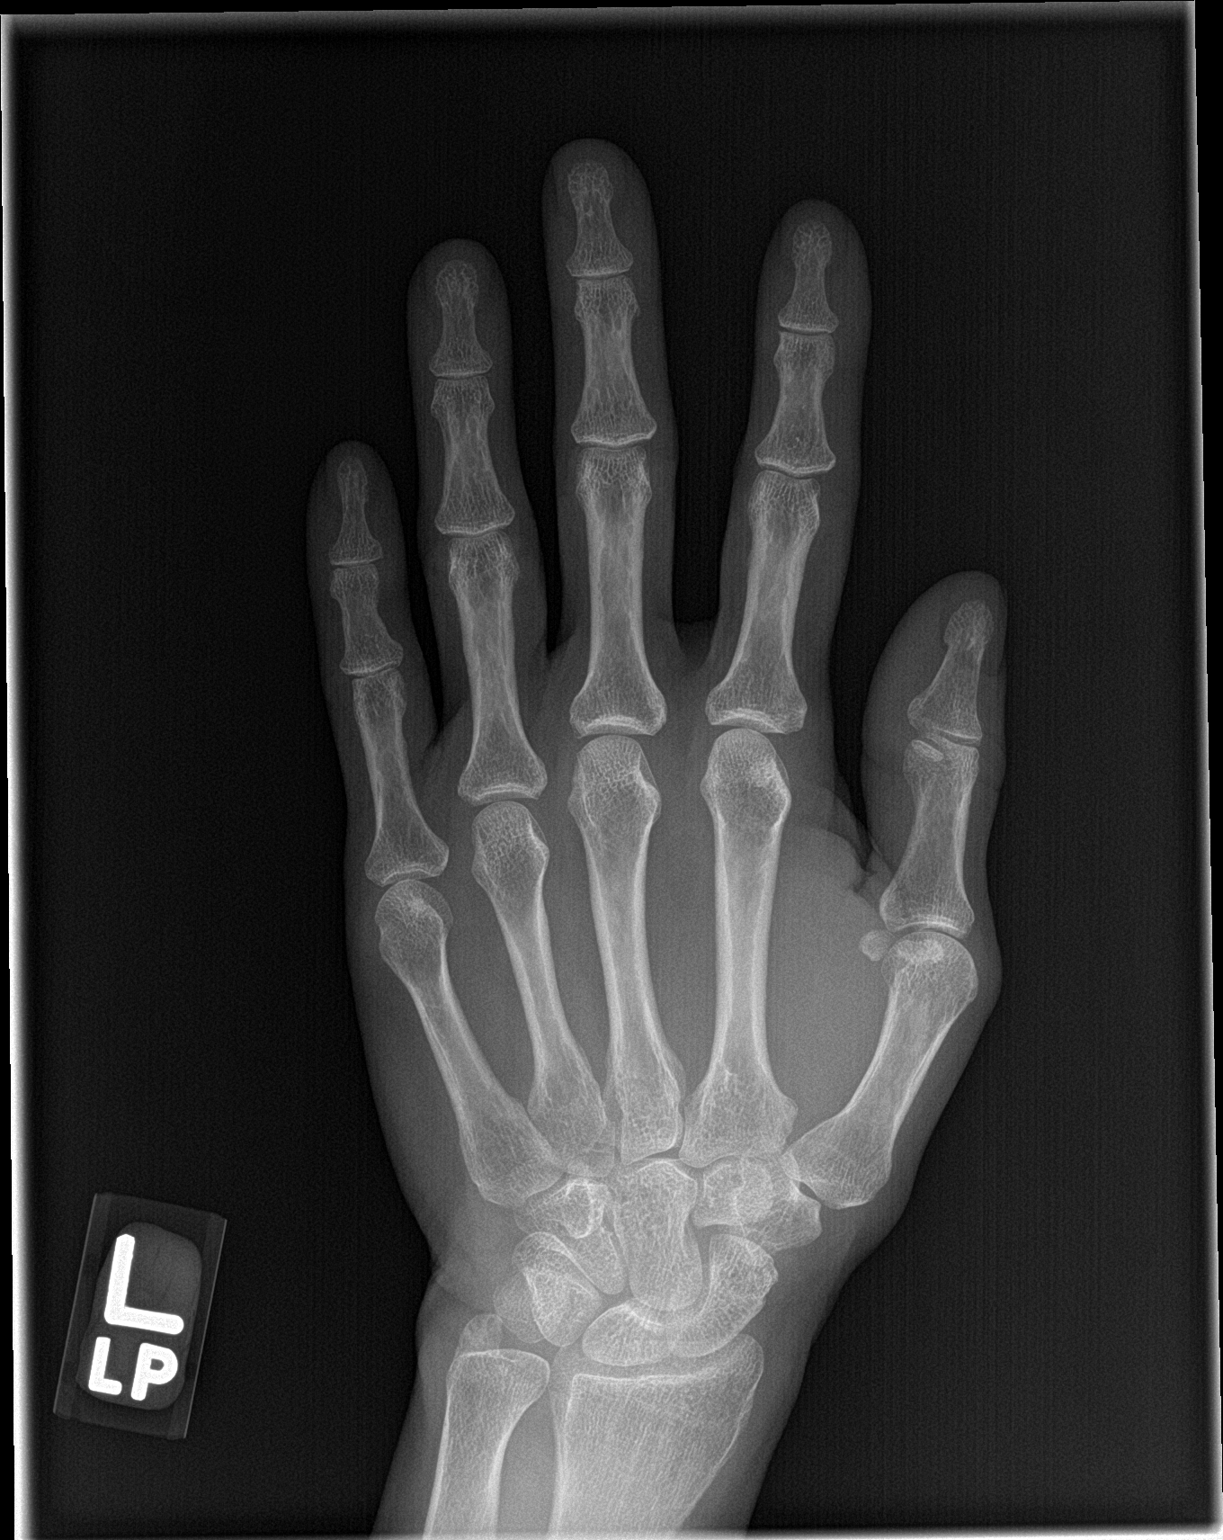

[hand obl]
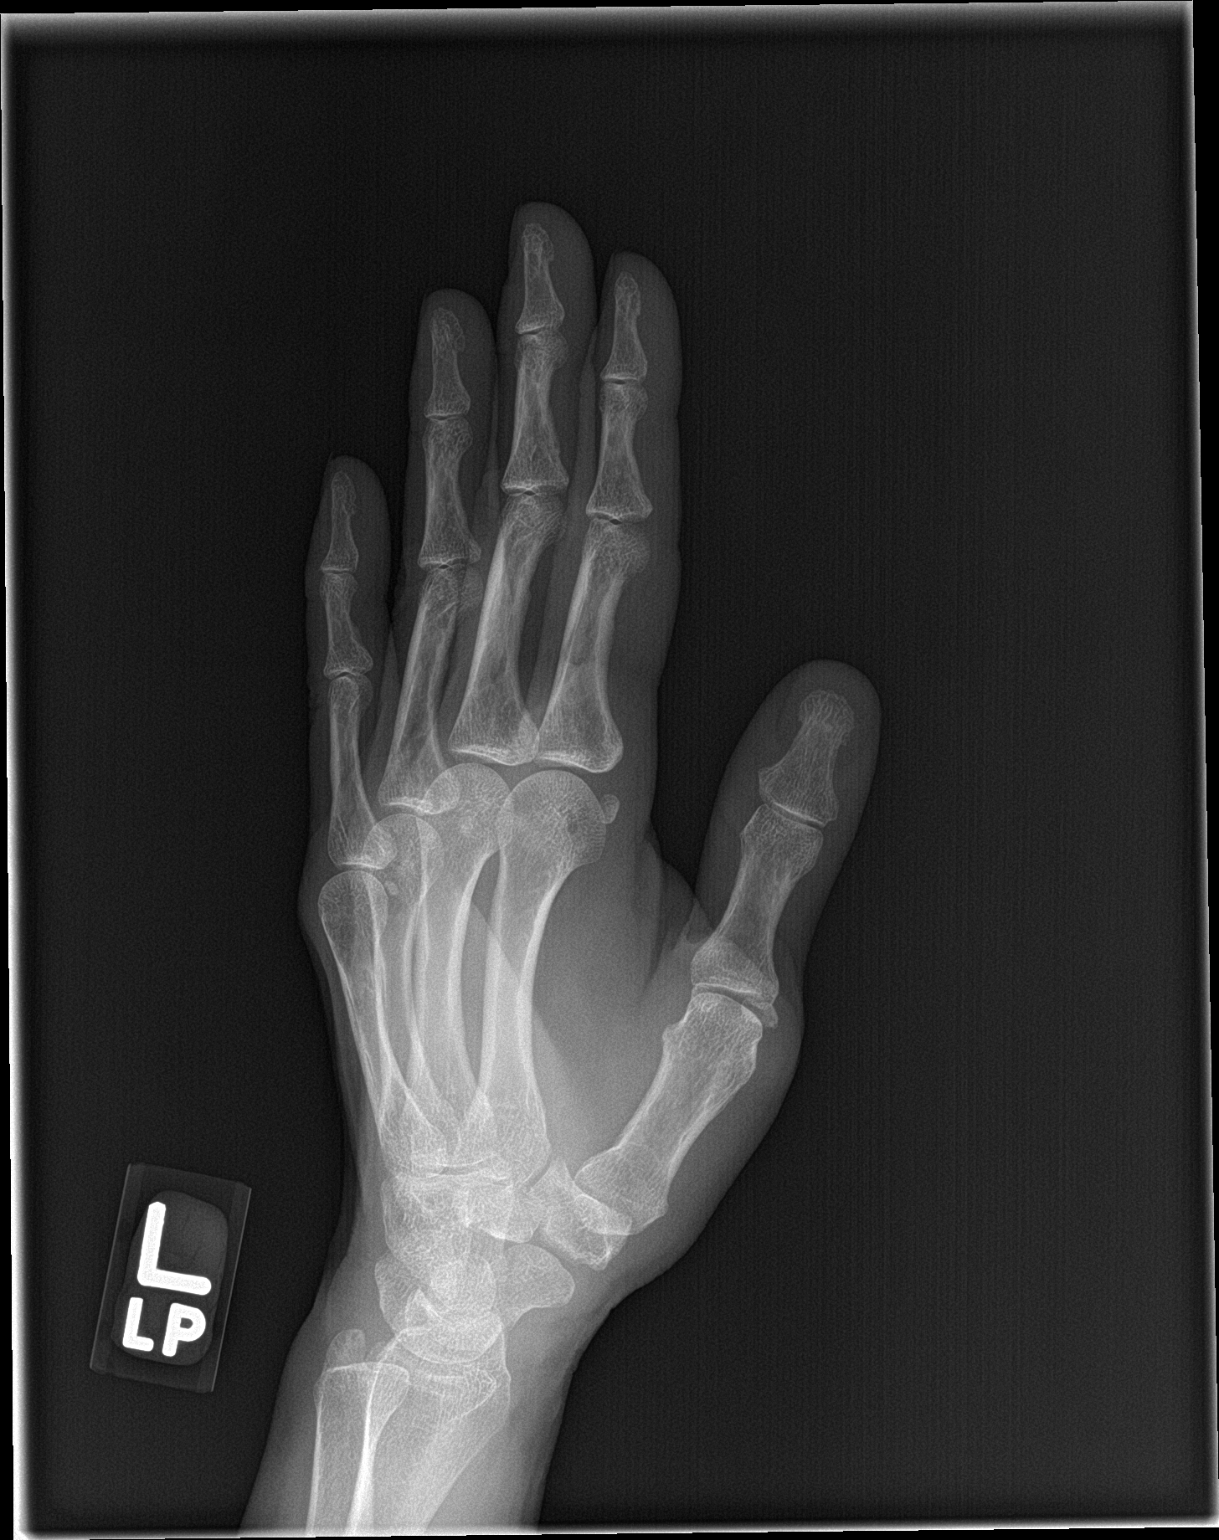

[hand lat]
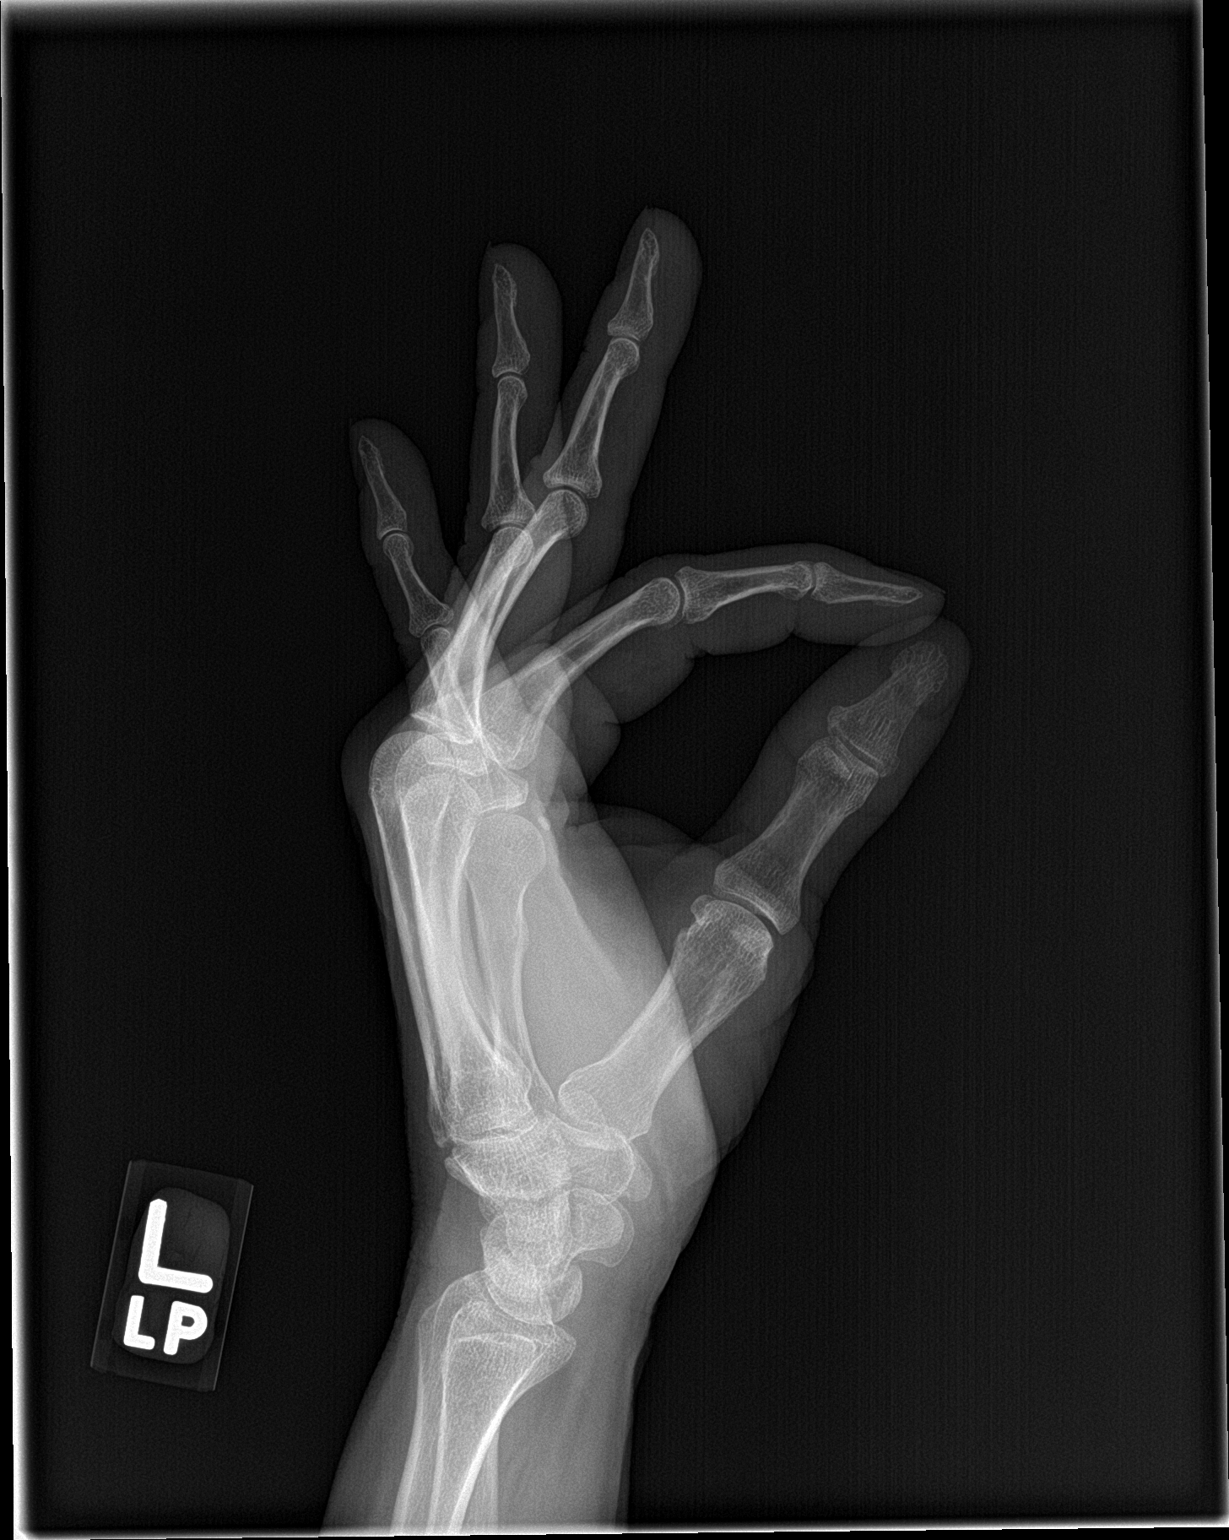

[3 of 3 positions shown; findings below may reference images not displayed]

FINDINGS: There is no evidence of fracture or dislocation. There is no
evidence of arthropathy or other focal bone abnormality. Soft
tissues are unremarkable.
IMPRESSION: Negative.

## 2021-06-01 ENCOUNTER — Other Ambulatory Visit: Payer: Self-pay | Admitting: Internal Medicine

## 2021-06-01 DIAGNOSIS — Z1231 Encounter for screening mammogram for malignant neoplasm of breast: Secondary | ICD-10-CM

## 2021-06-13 ENCOUNTER — Ambulatory Visit: Payer: Commercial Managed Care - PPO | Admitting: Obstetrics & Gynecology

## 2021-06-29 ENCOUNTER — Ambulatory Visit: Payer: Commercial Managed Care - PPO

## 2021-07-12 ENCOUNTER — Encounter: Payer: Commercial Managed Care - PPO | Admitting: Internal Medicine

## 2021-07-18 ENCOUNTER — Other Ambulatory Visit: Payer: Commercial Managed Care - PPO

## 2021-07-20 ENCOUNTER — Ambulatory Visit: Payer: Commercial Managed Care - PPO | Admitting: Endocrinology

## 2021-07-21 ENCOUNTER — Ambulatory Visit: Payer: Commercial Managed Care - PPO

## 2021-07-24 ENCOUNTER — Ambulatory Visit: Payer: Commercial Managed Care - PPO | Admitting: Obstetrics & Gynecology

## 2021-07-26 ENCOUNTER — Encounter: Payer: Commercial Managed Care - PPO | Admitting: Internal Medicine

## 2021-10-12 ENCOUNTER — Encounter: Payer: Self-pay | Admitting: Internal Medicine

## 2022-03-26 ENCOUNTER — Ambulatory Visit: Payer: Commercial Managed Care - PPO | Admitting: Obstetrics & Gynecology

## 2022-05-04 ENCOUNTER — Other Ambulatory Visit (HOSPITAL_COMMUNITY)
Admission: RE | Admit: 2022-05-04 | Discharge: 2022-05-04 | Disposition: A | Discharge status: Home / Self Care | Payer: Commercial Managed Care - PPO | Source: Ambulatory Visit | Attending: Obstetrics & Gynecology | Admitting: Obstetrics & Gynecology

## 2022-05-04 ENCOUNTER — Ambulatory Visit (INDEPENDENT_AMBULATORY_CARE_PROVIDER_SITE_OTHER): Payer: Commercial Managed Care - PPO | Admitting: Obstetrics & Gynecology

## 2022-05-04 ENCOUNTER — Encounter: Payer: Self-pay | Admitting: Obstetrics & Gynecology

## 2022-05-04 ENCOUNTER — Ambulatory Visit
Admission: RE | Admit: 2022-05-04 | Discharge: 2022-05-04 | Disposition: A | Discharge status: Home / Self Care | Payer: Commercial Managed Care - PPO | Source: Ambulatory Visit | Attending: Internal Medicine | Admitting: Internal Medicine

## 2022-05-04 VITALS — BP 120/80 | HR 64 | Ht 58.75 in | Wt 122.0 lb

## 2022-05-04 DIAGNOSIS — Z780 Asymptomatic menopausal state: Secondary | ICD-10-CM

## 2022-05-04 DIAGNOSIS — Z1231 Encounter for screening mammogram for malignant neoplasm of breast: Secondary | ICD-10-CM

## 2022-05-04 DIAGNOSIS — M8589 Other specified disorders of bone density and structure, multiple sites: Secondary | ICD-10-CM

## 2022-05-04 DIAGNOSIS — Z01419 Encounter for gynecological examination (general) (routine) without abnormal findings: Secondary | ICD-10-CM | POA: Diagnosis present

## 2022-05-04 NOTE — Progress Notes (Addendum)
Cathy Kim 1966/02/19 [id number]   History:    56 y.o. [id number] Separated   RP:  Established patient presenting for annual gyn exam    HPI: Postmenopausal, well on no hormone replacement therapy.  No PMB.  Intermittent lower abdominal discomfort x a few months.  Not associated with physical activities. No pain with intercourse.  Pap Neg 04/2019.  No h/o abnormal Pap.  Pap reflex today.  Urine and bowel movements normal.  Colono 05/2019.  Breasts normal.  Screening mammo scheduled today.  Body mass index good at 24.85.  Good fitness and healthy nutrition. Bone density 06/2021 Osteopenia T-Score -2.2 at [facility] with [name].  Recommend Vit D, Vit K2 and Ca++ total 1.5 g/d.  Increase wt bearing activities.  Health labs with family physician.   Past medical history,surgical history, family history and social history were all reviewed and documented in the EPIC chart.  Gynecologic History Patient's last menstrual period was 12/19/2015.  Obstetric History OB History  Gravida Para Term Preterm AB Living  4 3 3   1 3   SAB IAB Ectopic Multiple Live Births  1            # Outcome Date GA Lbr Len/2nd Weight Sex Delivery Anes PTL Lv  4 SAB           3 Term           2 Term           1 Term              ROS: A ROS was performed and pertinent positives and negatives are included in the history. GENERAL: No fevers or chills. HEENT: No change in vision, no earache, sore throat or sinus congestion. NECK: No pain or stiffness. CARDIOVASCULAR: No chest pain or pressure. No palpitations. PULMONARY: No shortness of breath, cough or wheeze. GASTROINTESTINAL: No abdominal pain, nausea, vomiting or diarrhea, melena or bright red blood per rectum. GENITOURINARY: No urinary frequency, urgency, hesitancy or dysuria. MUSCULOSKELETAL: No joint or muscle pain, no back pain, no recent trauma. DERMATOLOGIC: No rash, no itching, no lesions. ENDOCRINE: No polyuria, polydipsia, no heat or cold  intolerance. No recent change in weight. HEMATOLOGICAL: No anemia or easy bruising or bleeding. NEUROLOGIC: No headache, seizures, numbness, tingling or weakness. PSYCHIATRIC: No depression, no loss of interest in normal activity or change in sleep pattern.     Exam:   BP 120/80   Pulse 64   Ht 4' 10.75" (1.492 m)   Wt 122 lb (55.3 kg)   LMP 12/19/2015   SpO2 99%   BMI 24.85 kg/m   Body mass index is 24.85 kg/m.  General appearance : Well developed well nourished female. No acute distress HEENT: Eyes: no retinal hemorrhage or exudates,  Neck supple, trachea midline, no carotid bruits, no thyroidmegaly Lungs: Clear to auscultation, no rhonchi or wheezes, or rib retractions  Heart: Regular rate and rhythm, no murmurs or gallops Breast:Examined in sitting and supine position were symmetrical in appearance, no palpable masses or tenderness,  no skin retraction, no nipple inversion, no nipple discharge, no skin discoloration, no axillary or supraclavicular lymphadenopathy Abdomen: no palpable masses or tenderness, no rebound or guarding Extremities: no edema or skin discoloration or tenderness  Pelvic: Vulva: Normal             Vagina: No gross lesions or discharge  Cervix: No gross lesions or discharge.  Pap reflex done.  Uterus  AV, normal size,  shape and consistency, non-tender and mobile  Adnexa  Without masses or tenderness  Anus: Normal   Assessment/Plan:  55 y.o. female for annual exam   1. Encounter for routine gynecological examination with Papanicolaou smear of cervix Postmenopausal, well on no hormone replacement therapy.  No PMB.  Intermittent lower abdominal discomfort x a few months.  Probably intestinal in origin.  Will try changing her diet.  Not associated with physical activities. No pain with intercourse. Pap Neg 04/2019.  No h/o abnormal Pap.  Pap reflex today.  Urine and bowel movements normal.  Colono 05/2019.  Breasts normal. Screening mammo scheduled today.   Body mass index good at 24.85.  Good fitness and healthy nutrition. Bone density 06/2021 Osteopenia T-Score -2.2 at [facility] with [name].  Recommend Vit D, Vit K2 and Ca++ total 1.5 g/d.  Increase wt bearing activities.  Health labs with family physician. - Cytology - PAP( [name])  2. Postmenopause Postmenopausal, well on no hormone replacement therapy.  No PMB.    3. Osteopenia of multiple sites Body mass index good at 24.85.  Good fitness and healthy nutrition. Bone density 06/2021 Osteopenia T-Score -2.2 at [facility] with [name].  Recommend Vit D, Vit K2 and Ca++ total 1.5 g/d.  Increase wt bearing activities.  Repeat BD in 06/2023.  Other orders - Riboflavin (B2 PO); Take by mouth. - VITAMIN E PO; Take by mouth.   Genia Del MD, 8:56 AM 05/04/2022

## 2022-05-04 NOTE — Addendum Note (Signed)
Addended by: Princess Bruins on: 05/04/2022 11:38 AM   Modules accepted: Orders

## 2022-05-08 LAB — CYTOLOGY - PAP
Diagnosis: NEGATIVE
Diagnosis: REACTIVE

## 2022-05-18 ENCOUNTER — Encounter: Payer: Commercial Managed Care - PPO | Admitting: Internal Medicine

## 2022-06-22 ENCOUNTER — Ambulatory Visit: Payer: Commercial Managed Care - PPO | Admitting: Internal Medicine

## 2022-07-20 ENCOUNTER — Ambulatory Visit: Payer: Commercial Managed Care - PPO | Admitting: Endocrinology

## 2022-08-08 ENCOUNTER — Ambulatory Visit: Payer: Commercial Managed Care - PPO | Admitting: Medical

## 2022-08-08 ENCOUNTER — Encounter: Payer: Self-pay | Admitting: Medical

## 2022-08-10 ENCOUNTER — Encounter: Payer: Self-pay | Admitting: Endocrinology

## 2022-08-10 ENCOUNTER — Ambulatory Visit: Payer: Commercial Managed Care - PPO | Admitting: Endocrinology

## 2022-08-10 VITALS — BP 106/70 | HR 77 | Ht 59.0 in | Wt 124.2 lb

## 2022-08-10 DIAGNOSIS — R5383 Other fatigue: Secondary | ICD-10-CM | POA: Diagnosis not present

## 2022-08-10 DIAGNOSIS — E041 Nontoxic single thyroid nodule: Secondary | ICD-10-CM

## 2022-08-10 LAB — VITAMIN B12: Vitamin B-12: >1500 pg/mL — ABNORMAL HIGH (ref 211–911)

## 2022-08-10 LAB — TSH: TSH: 0.48 u[IU]/mL (ref 0.35–5.50)

## 2022-08-10 LAB — T3, FREE: T3, Free: 4.2 pg/mL (ref 2.3–4.2)

## 2022-08-10 LAB — T4, FREE: Free T4: 0.81 ng/dL (ref 0.60–1.60)

## 2022-08-10 NOTE — Progress Notes (Signed)
Patient ID: Cathy Kim, female   DOB: 08-Jun-1966, 56 y.o.   MRN: [id number]            Reason for Appointment: Follow-up of thyroid nodule    History of Present Illness:   The patient's thyroid nodule was first discovered in 02/2007 on a routine exam The thyroid ultrasound report is not available However she had a needle aspiration done for her left-sided thyroid nodule which was reported as benign goiter   RECENT history   She was last seen in 6/22.  She came back because of symptoms of discomfort in her left neck and she felt that the swelling in the thyroid is more She also feels more tired than usual No significant recent weight change She also thinks that sometimes her voice is little hoarse No neck discomfort or difficulty swallowing   At times previously on her labs she has had a mildly decreased TSH level without increased T4 or T3  Thyroid levels pending  Lab Results  Component Value Date   FREET4 0.66 01/05/2021   FREET4 0.81 07/05/2020   FREET4 0.86 10/29/2019   TSH 0.49 01/05/2021   TSH 0.18 (L) 07/05/2020   TSH 0.54 02/02/2020   Lab Results  Component Value Date   T3FREE 3.7 01/05/2021   T3FREE 3.3 07/05/2020   T3FREE 3.8 01/03/2018    THYROID nodule:  had a 1.5 cm left-sided nodule on her physical exam  Last ultrasound showed multiple small nodules, has 4 nodules over 1 cm Largest nodule 1.3 cm on the left inferior location and follow-up recommended on radiological characteristics   Cytology report from needle aspiration of the left-sided nodule in 07/2007:  The specimen contains small to medium sized sheets of banal appearing follicular epithelial cells, some with oncocytic cell  changes. In addition, in the background, there is abundant inflammation and scattered colloid. Diagnosis: Nonneoplastic goiter  Allergies as of 08/10/2022   No Known Allergies      Medication List        Accurate as of August 10, 2022 11:56 AM. If  you have any questions, ask your nurse or doctor.          B-12 PO Take by mouth.   B2 PO Take by mouth.   OMEGA-3 FISH OIL PO Take by mouth.   VITAMIN C PO Take 1 tablet by mouth daily.   Vitamin D 50 MCG (2000 UT) tablet Take 1 Units by mouth daily.   VITAMIN E PO Take by mouth.   VITAMIN K2 PO Take by mouth.        Allergies: No Known Allergies  Past Medical History:  Diagnosis Date   Thyroid nodule    benign FNA per patient     Past Surgical History:  Procedure Laterality Date   OVARIAN CYST REMOVAL Left 1994   UPPER GASTROINTESTINAL ENDOSCOPY      Family History  Problem Relation Age of Onset   Osteoporosis Mother    GER disease Mother    Diabetes Father    Prostate cancer Father    Osteoarthritis Sister    Pancreatic cancer Maternal Uncle    Thyroid disease Paternal Aunt        Goiter    Social History:  reports that she has never smoked. She has never used smokeless tobacco. She reports that she does not drink alcohol and does not use drugs.   Review of Systems:    Wt Readings from Last 3 Encounters:  08/10/22  124 lb 3.2 oz (56.3 kg)  05/04/22 122 lb (55.3 kg)  01/26/21 123 lb 3.2 oz (55.9 kg)     Examination:   BP 106/70   Pulse 77   Ht 4\' 11"  (1.499 m)   Wt 124 lb 3.2 oz (56.3 kg)   LMP 12/19/2015   SpO2 97%   BMI 25.09 kg/m           The left thyroid nodule is palpable on the medial part of the left lobe, about 1-1.5 cm in size, smooth, firm Smaller pea-sized nodule felt on the medial upper part also  Right lobe is feels normal   There is no lymphadenopathy in the neck   She has mild prominence of the left sternomastoid muscle  Biceps reflexes appear normal  Assessment/Plan:   Thyroid nodule on the left: She has had a benign thyroid nodule since 2008 The nodule on exam is slightly smaller compared to her previous visit  Discussed that the swelling she is feeling in her neck along with discomfort is likely to  be from muscle spasm However will check thyroid levels and B12 today  If thyroid levels are normal she will need to follow-up with her PCP for further evaluation  Otherwise she will come back annually for visits   2009 08/10/2022

## 2022-10-26 ENCOUNTER — Encounter: Payer: Self-pay | Admitting: Internal Medicine

## 2022-10-26 ENCOUNTER — Ambulatory Visit: Payer: Commercial Managed Care - PPO | Admitting: Internal Medicine

## 2022-10-26 VITALS — BP 120/72 | HR 71 | Temp 98.1°F | Resp 16 | Ht 59.0 in | Wt 127.1 lb

## 2022-10-26 DIAGNOSIS — H9319 Tinnitus, unspecified ear: Secondary | ICD-10-CM | POA: Diagnosis not present

## 2022-10-26 DIAGNOSIS — M25462 Effusion, left knee: Secondary | ICD-10-CM

## 2022-10-26 DIAGNOSIS — M7122 Synovial cyst of popliteal space [Baker], left knee: Secondary | ICD-10-CM

## 2022-10-26 DIAGNOSIS — M542 Cervicalgia: Secondary | ICD-10-CM | POA: Diagnosis not present

## 2022-10-26 DIAGNOSIS — R1030 Lower abdominal pain, unspecified: Secondary | ICD-10-CM | POA: Diagnosis not present

## 2022-10-26 DIAGNOSIS — G8929 Other chronic pain: Secondary | ICD-10-CM

## 2022-10-26 NOTE — Progress Notes (Unsigned)
Subjective:    Patient ID: Cathy Kim, female    DOB: Mar 19, 1966, 57 y.o.   MRN: [id number]  DOS:  10/26/2022 Type of visit - description: Multiple issues, last seen 2021  5 months history of swelling at the left popliteal area.  Denies any pain or injury.  chronic neck pain, getting worse, referral?. Pain is mostly at the left side triggered by neck motion.  Tinnitus for almost a year, seems bilateral to hurt, no associated HOH.  No major problem with allergies.  Also, 87-month history of bilateral lower abdominal discomfort. Happen 3-4 times a week, last about 3 hours, no dyspareunia. When asked about the description of the pain, she thinks is similar to when she had menses.  No nausea vomiting.  No diarrhea No fever or chills No weight loss. No blood in the stools. No vaginal discharge or vaginal bleeding. Occasionally has uncomfortable although her from the pelvic area.  (Recommend to discuss with gynecology).   Review of Systems See above   Past Medical History:  Diagnosis Date   Thyroid nodule    benign FNA per patient    Past Surgical History:  Procedure Laterality Date   OVARIAN CYST REMOVAL Left 1994   UPPER GASTROINTESTINAL ENDOSCOPY      Current Outpatient Medications  Medication Instructions   Ascorbic Acid (VITAMIN C PO) 1 tablet, Oral, Daily   Cyanocobalamin (B-12 PO) Oral   Menaquinone-7 (VITAMIN K2 PO) Oral   Omega-3 Fatty Acids (OMEGA-3 FISH OIL PO) Oral   Riboflavin (B2 PO) Oral   Vitamin D 1 Units, Oral, Daily   VITAMIN E PO Oral       Objective:   Physical Exam BP 120/72   Pulse 71   Temp 98.1 F (36.7 C) (Oral)   Resp 16   Ht 4\' 11"  (1.499 m)   Wt 127 lb 2 oz (57.7 kg)   LMP 12/19/2015   SpO2 97%   BMI 25.68 kg/m  General:   Well developed, NAD, BMI noted.  HEENT:  Normocephalic . Face symmetric, atraumatic.  TMs normal.  Throat symmetric and not red.  Nose not congested Lungs:  CTA B Normal respiratory effort,  no intercostal retractions, no accessory muscle use. Heart: RRR,  no murmur.  Abdomen:  Not distended, soft, non-tender. No rebound or rigidity.   Skin: Not pale. Not jaundice Lower extremities: no pretibial edema bilaterally. Right knee normal Left knee: She has a soft, nonpulsatile mass on the popliteal area.  Approximately 1.5 x 1.5 inches.  Not warm, not red. Neurologic:  alert & oriented X3.  Speech normal, gait appropriate for age and unassisted Psych--  Cognition and judgment appear intact.  Cooperative with normal attention span and concentration.  Behavior appropriate. No anxious or depressed appearing.     Assessment    ASSESSMENT   (new pt 09-2017)  Dysphagia: Saw GI 08-2017, Rx EGD.  EGD 3-25: Normal, dilated. 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 Menopausal 2018 Palpable aorta on exam 12/2017 Vitamin D deficiency   PLAN Multiple issues. Chronic neck pain: Gradually getting worse per patient, left-sided.  Request further evaluation.  Refer to Ortho. Baker's cyst: Swelling behind the left knee likely related to a Baker's cyst.  Recommend to discuss with daughter to confirm diagnosis.  At this point I do not suspect DVT. Tinnitus: Without HOH, not disrupting her activities, recommend observation. Lower abdominal pain: Bilateral lower abdominal pain as  described above, ROS benign, saw gynecology 05/04/2022, had a brief discussion with the gynecologist about the issue and apparently GYN did not feel this was female related. Recommend a UA, urine culture, CBC CMP and reassess in 3 months. Patient is very concerned about the issue and request further evaluation.  Will proceed with CT abdomen and pelvis with results. RTC for CPX in 3 months

## 2022-10-26 NOTE — Patient Instructions (Addendum)
Will arrange for a CT of the abdominal pelvis next week  We are referring you to the orthopedic doctor for: Neck pain Left knee swelling  GO TO THE LAB : Get the blood work     Bud, [place] back for   a physical exam in 3 months.

## 2022-10-27 LAB — COMPREHENSIVE METABOLIC PANEL
AG Ratio: 1.7 (calc) (ref 1.0–2.5)
ALT: 16 U/L (ref 6–29)
AST: 18 U/L (ref 10–35)
Albumin: 4.3 g/dL (ref 3.6–5.1)
Alkaline phosphatase (APISO): 98 U/L (ref 37–153)
BUN: 20 mg/dL (ref 7–25)
CO2: 26 mmol/L (ref 20–32)
Calcium: 9.7 mg/dL (ref 8.6–10.4)
Chloride: 104 mmol/L (ref 98–110)
Creat: 0.76 mg/dL (ref 0.50–1.03)
Globulin: 2.6 g/dL (calc) (ref 1.9–3.7)
Glucose, Bld: 90 mg/dL (ref 65–99)
Potassium: 4.4 mmol/L (ref 3.5–5.3)
Sodium: 140 mmol/L (ref 135–146)
Total Bilirubin: 0.3 mg/dL (ref 0.2–1.2)
Total Protein: 6.9 g/dL (ref 6.1–8.1)

## 2022-10-27 LAB — CBC WITH DIFFERENTIAL/PLATELET
Absolute Monocytes: 336 cells/uL (ref 200–950)
Basophils Absolute: 17 cells/uL (ref 0–200)
Basophils Relative: 0.3 %
Eosinophils Absolute: 57 cells/uL (ref 15–500)
Eosinophils Relative: 1 %
HCT: 37.9 % (ref 35.0–45.0)
Hemoglobin: 12.2 g/dL (ref 11.7–15.5)
Lymphs Abs: 2366 cells/uL (ref 850–3900)
MCH: 28.2 pg (ref 27.0–33.0)
MCHC: 32.2 g/dL (ref 32.0–36.0)
MCV: 87.5 fL (ref 80.0–100.0)
MPV: 11.4 fL (ref 7.5–12.5)
Monocytes Relative: 5.9 %
Neutro Abs: 2924 cells/uL (ref 1500–7800)
Neutrophils Relative %: 51.3 %
Platelets: 238 10*3/uL (ref 140–400)
RBC: 4.33 10*6/uL (ref 3.80–5.10)
RDW: 17.2 % — ABNORMAL HIGH (ref 11.0–15.0)
Total Lymphocyte: 41.5 %
WBC: 5.7 10*3/uL (ref 3.8–10.8)

## 2022-10-27 LAB — URINE CULTURE
MICRO NUMBER:: 14698687
Result:: NO GROWTH
SPECIMEN QUALITY:: ADEQUATE

## 2022-10-27 LAB — URINALYSIS, ROUTINE W REFLEX MICROSCOPIC
Bilirubin Urine: NEGATIVE
Glucose, UA: NEGATIVE
Hgb urine dipstick: NEGATIVE
Ketones, ur: NEGATIVE
Leukocytes,Ua: NEGATIVE
Nitrite: NEGATIVE
Protein, ur: NEGATIVE
Specific Gravity, Urine: 1.022 (ref 1.001–1.035)
pH: 6 (ref 5.0–8.0)

## 2022-10-28 NOTE — Assessment & Plan Note (Signed)
Multiple issues. Chronic neck pain: Gradually getting worse per patient, left-sided.  Request further evaluation.  Refer to Ortho. Baker's cyst: Swelling behind the left knee likely related to a Baker's cyst.  Recommend to discuss w/ ortho and confitm the dx. Don't  suspect DVT. Tinnitus: Without HOH, not disrupting her activities, recommend observation. Lower abdominal pain: Bilateral lower abdominal pain as described above, ROS benign, saw gynecology 05/04/2022, had a brief discussion with the gynecologist about the issue and apparently GYN did not feel this was female related. Recommend a UA, urine culture, CBC CMP and reassess in 3 months. Patient is very concerned about the issue and request further evaluation.  Will proceed with CT abdomen and pelvis with lab results. RTC for CPX in 3 months

## 2022-10-29 NOTE — Addendum Note (Signed)
Addended byDamita Dunnings D on: 10/29/2022 07:59 AM   Modules accepted: Orders

## 2022-11-30 ENCOUNTER — Ambulatory Visit: Payer: Commercial Managed Care - PPO | Admitting: Orthopedic Surgery

## 2022-11-30 LAB — AMB RESULTS CONSOLE CBG: Glucose: 120

## 2022-11-30 NOTE — Progress Notes (Signed)
Pt has PCP. Given information about lifestyle medicine.

## 2022-12-17 ENCOUNTER — Encounter: Payer: Self-pay | Admitting: *Deleted

## 2022-12-17 NOTE — Progress Notes (Signed)
Pt attended 11/30/22 event where screening results were wnl. At event pt did not identify any SDOH insecurities. Chart review confirms pt's PCP is Dr.Jose Paz at [facility], where pt was last seen on 10/26/22 and where she has a future appt for 01/25/23. No additional health equity team support indicated at this time.

## 2023-01-02 ENCOUNTER — Encounter: Payer: Self-pay | Admitting: Internal Medicine

## 2023-01-24 ENCOUNTER — Encounter: Payer: Self-pay | Admitting: Internal Medicine

## 2023-01-25 ENCOUNTER — Ambulatory Visit: Payer: Commercial Managed Care - PPO | Admitting: Internal Medicine

## 2023-04-03 ENCOUNTER — Emergency Department (HOSPITAL_BASED_OUTPATIENT_CLINIC_OR_DEPARTMENT_OTHER)
Admission: EM | Admit: 2023-04-03 | Discharge: 2023-04-03 | Disposition: A | Discharge status: Home / Self Care | Payer: Commercial Managed Care - PPO | Attending: Emergency Medicine | Admitting: Emergency Medicine

## 2023-04-03 ENCOUNTER — Encounter (HOSPITAL_BASED_OUTPATIENT_CLINIC_OR_DEPARTMENT_OTHER): Payer: Self-pay | Admitting: Emergency Medicine

## 2023-04-03 ENCOUNTER — Other Ambulatory Visit: Payer: Self-pay

## 2023-04-03 DIAGNOSIS — R1033 Periumbilical pain: Secondary | ICD-10-CM | POA: Diagnosis not present

## 2023-04-03 DIAGNOSIS — R197 Diarrhea, unspecified: Secondary | ICD-10-CM | POA: Insufficient documentation

## 2023-04-03 LAB — COMPREHENSIVE METABOLIC PANEL
ALT: 22 U/L (ref 0–44)
AST: 23 U/L (ref 15–41)
Albumin: 4.1 g/dL (ref 3.5–5.0)
Alkaline Phosphatase: 68 U/L (ref 38–126)
Anion gap: 9 (ref 5–15)
BUN: 14 mg/dL (ref 6–20)
CO2: 23 mmol/L (ref 22–32)
Calcium: 8.6 mg/dL — ABNORMAL LOW (ref 8.9–10.3)
Chloride: 106 mmol/L (ref 98–111)
Creatinine, Ser: 0.59 mg/dL (ref 0.44–1.00)
GFR, Estimated: >60 mL/min (ref >60)
Glucose, Bld: 88 mg/dL (ref 70–99)
Potassium: 3.9 mmol/L (ref 3.5–5.1)
Sodium: 138 mmol/L (ref 135–145)
Total Bilirubin: 0.4 mg/dL (ref 0.3–1.2)
Total Protein: 6.4 g/dL — ABNORMAL LOW (ref 6.5–8.1)

## 2023-04-03 LAB — URINALYSIS, ROUTINE W REFLEX MICROSCOPIC
Bacteria, UA: NONE SEEN
Bilirubin Urine: NEGATIVE
Glucose, UA: NEGATIVE mg/dL
Ketones, ur: NEGATIVE mg/dL
Leukocytes,Ua: NEGATIVE
Nitrite: NEGATIVE
Specific Gravity, Urine: 1.031 — ABNORMAL HIGH (ref 1.005–1.030)
pH: 5.5 (ref 5.0–8.0)

## 2023-04-03 LAB — CBC
HCT: 36.4 % (ref 36.0–46.0)
Hemoglobin: 11.7 g/dL — ABNORMAL LOW (ref 12.0–15.0)
MCH: 27.7 pg (ref 26.0–34.0)
MCHC: 32.1 g/dL (ref 30.0–36.0)
MCV: 86.3 fL (ref 80.0–100.0)
Platelets: 182 10*3/uL (ref 150–400)
RBC: 4.22 MIL/uL (ref 3.87–5.11)
RDW: 18 % — ABNORMAL HIGH (ref 11.5–15.5)
WBC: 3.9 10*3/uL — ABNORMAL LOW (ref 4.0–10.5)
nRBC: 0 % (ref 0.0–0.2)

## 2023-04-03 LAB — MAGNESIUM: Magnesium: 1.7 mg/dL (ref 1.7–2.4)

## 2023-04-03 LAB — LIPASE, BLOOD: Lipase: 25 U/L (ref 11–51)

## 2023-04-03 MED ORDER — LOPERAMIDE HCL 2 MG PO CAPS: ORAL_CAPSULE | ORAL | 0 refills | Status: DC

## 2023-04-03 MED ORDER — FLORANEX PO PACK
1.0000 g | PACK | Freq: Three times a day (TID) | ORAL | 1 refills | Status: AC
Start: 2023-04-03 — End: ?

## 2023-04-03 MED ORDER — SODIUM CHLORIDE 0.9 % IV BOLUS
1000.0000 mL | Freq: Once | INTRAVENOUS | Status: AC
  Administered 2023-04-03: 1000 mL via INTRAVENOUS

## 2023-04-03 NOTE — ED Triage Notes (Signed)
Pt via pov from home with diarrhea since Sunday morning. She reports that she has many episodes per day of watery diarrhea; she has lost 8# in 3 days. Endorses emesis on sunday, continuing nausea. Pt alert & oriented, nad noted.

## 2023-04-03 NOTE — ED Notes (Signed)
Pt given discharge instructions. Opportunities given for questions. Pt verbalizes understanding. Stone,Heather R, RN 

## 2023-04-03 NOTE — Discharge Instructions (Addendum)
Please read and follow all provided instructions.  Your diagnoses today include:  1. Diarrhea, unspecified type     Tests performed today include: Blood cell counts and platelets Kidney and liver function tests: Normal kidney function Electrolytes including magnesium are normal Pancreas function test (called lipase) Urine test to look for infection: suggests mild dehydration Vital signs. See below for your results today.   Medications prescribed:  Imodium: Medication for diarrhea  Take any prescribed medications only as directed.  Home care instructions:  Follow any educational materials contained in this packet.  Follow-up instructions: Please follow-up with your primary care provider in the next 2 days for further evaluation of your symptoms.  If your symptoms persist, you will likely need stool testing to help determine appropriate treatment.  Return instructions:  SEEK IMMEDIATE MEDICAL ATTENTION IF: The pain does not go away or becomes severe  A temperature above 101F develops  Repeated vomiting occurs (multiple episodes)  The pain becomes localized to portions of the abdomen. The right side could possibly be appendicitis. In an adult, the left lower portion of the abdomen could be colitis or diverticulitis.  Blood is being passed in stools or vomit (bright red or black tarry stools)  You develop chest pain, difficulty breathing, dizziness or fainting, or become confused, poorly responsive, or inconsolable (young children) If you have any other emergent concerns regarding your health  Additional Information: Abdominal (belly) pain can be caused by many things. Your caregiver performed an examination and possibly ordered blood/urine tests and imaging (CT scan, x-rays, ultrasound). Many cases can be observed and treated at home after initial evaluation in the emergency department. Even though you are being discharged home, abdominal pain can be unpredictable. Therefore, you  need a repeated exam if your pain does not resolve, returns, or worsens. Most patients with abdominal pain don't have to be admitted to the hospital or have surgery, but serious problems like appendicitis and gallbladder attacks can start out as nonspecific pain. Many abdominal conditions cannot be diagnosed in one visit, so follow-up evaluations are very important.  Your vital signs today were: BP 103/69   Pulse 61   Temp 98.6 F (37 C) (Oral)   Resp 17   Ht 4\' 11"  (1.499 m)   Wt 57.7 kg   LMP 12/19/2015   SpO2 98%   BMI 25.69 kg/m  If your blood pressure (bp) was elevated above 135/85 this visit, please have this repeated by your doctor within one month. --------------

## 2023-04-03 NOTE — ED Provider Notes (Signed)
[place] EMERGENCY DEPARTMENT AT [facility] Provider Note   CSN: [id number] Arrival date & time: 04/03/23  1108     History  Chief Complaint  Patient presents with   Diarrhea    Cathy Kim is a 57 y.o. female.  Patient with no past surgical history, history of upper endoscopy for stricture --presents to the emergency department today for evaluation of diarrhea.  Symptoms started on 04/01/2023.  She reports passing liquid, watery, nonbloody stool, approximately every 30 minutes.  This started in the morning.  The night prior she was at a restaurant with friends, but states that none of her friends have similar symptoms.  Earlier the day before she [place] and did not intentionally ingest any water, but thinks that she may have swallowed a little bit of water.  She denies any other suspicious food or water exposures.  She denies any preceding antibiotic use.  She denies fever, chest pain, shortness of breath.  No vomiting.  No focal abdominal pain, just some mild cramping that comes and goes.  No urinary symptoms.       Home Medications Prior to Admission medications   Medication Sig Start Date End Date Taking? Authorizing Provider  Ascorbic Acid (VITAMIN C PO) Take 1 tablet by mouth daily.    [provider]  Cholecalciferol (VITAMIN D) 50 MCG (2000 UT) tablet Take 1 Units by mouth daily.    [provider]  Cyanocobalamin (B-12 PO) Take by mouth.    [provider]  Menaquinone-7 (VITAMIN K2 PO) Take by mouth.    [provider]  Omega-3 Fatty Acids (OMEGA-3 FISH OIL PO) Take by mouth.    [provider]  Riboflavin (B2 PO) Take by mouth.    [provider]  VITAMIN E PO Take by mouth.    [provider]      Allergies    Patient has no known allergies.    Review of Systems   Review of Systems  Physical Exam Updated Vital Signs BP 115/82 (BP Location: Right Arm)   Pulse  79   Temp 98.6 F (37 C) (Oral)   Resp 18   Ht 4\' 11"  (1.499 m)   Wt 57.7 kg   LMP 12/19/2015   SpO2 100%   BMI 25.69 kg/m   Physical Exam Vitals and nursing note reviewed.  Constitutional:      General: She is not in acute distress.    Appearance: She is well-developed.  HENT:     Head: Normocephalic and atraumatic.     Right Ear: External ear normal.     Left Ear: External ear normal.     Nose: Nose normal.  Eyes:     Conjunctiva/sclera: Conjunctivae normal.  Cardiovascular:     Rate and Rhythm: Normal rate and regular rhythm.     Heart sounds: No murmur heard. Pulmonary:     Effort: No respiratory distress.     Breath sounds: No wheezing, rhonchi or rales.  Abdominal:     Palpations: Abdomen is soft.     Tenderness: There is abdominal tenderness. There is no guarding or rebound.     Comments: Minimal periumbilical tenderness without rebound or guarding  Musculoskeletal:     Cervical back: Normal range of motion and neck supple.     Right lower leg: No edema.     Left lower leg: No edema.  Skin:    General: Skin is warm and dry.  Findings: No rash.  Neurological:     General: No focal deficit present.     Mental Status: She is alert. Mental status is at baseline.     Motor: No weakness.  Psychiatric:        Mood and Affect: Mood normal.     ED Results / Procedures / Treatments   Labs (all labs ordered are listed, but only abnormal results are displayed) Labs Reviewed  COMPREHENSIVE METABOLIC PANEL - Abnormal; Notable for the following components:      Result Value   Calcium 8.6 (*)    Total Protein 6.4 (*)    All other components within normal limits  CBC - Abnormal; Notable for the following components:   WBC 3.9 (*)    Hemoglobin 11.7 (*)    RDW 18.0 (*)    All other components within normal limits  URINALYSIS, ROUTINE W REFLEX MICROSCOPIC - Abnormal; Notable for the following components:   Specific Gravity, Urine 1.031 (*)    Hgb urine dipstick  TRACE (*)    Protein, ur TRACE (*)    All other components within normal limits  GASTROINTESTINAL PANEL BY PCR, STOOL (REPLACES STOOL CULTURE)  LIPASE, BLOOD  MAGNESIUM    EKG None  Radiology No results found.  Procedures Procedures    Medications Ordered in ED Medications - No data to display  ED Course/ Medical Decision Making/ A&P    Patient seen and examined. History obtained directly from patient.  Urine at bedside, appears dark.  Labs/EKG: Ordered CBC, CMP, lipase, UA ordered in triage.  I added magnesium and GI pathogen panel.  Imaging: None ordered  Medications/Fluids: Ordered: IV fluid bolus.   Most recent vital signs reviewed and are as follows: BP 115/82 (BP Location: Right Arm)   Pulse 79   Temp 98.6 F (37 C) (Oral)   Resp 18   Ht 4\' 11"  (1.499 m)   Wt 57.7 kg   LMP 12/19/2015   SpO2 100%   BMI 25.69 kg/m   Initial impression: Diarrhea  2:33 PM Reassessment performed. Patient appears comfortable.  She was able to get some rest.  She was unable to give a stool sample here as she has not had any further diarrhea.  Labs personally reviewed and interpreted including:   Imaging personally visualized and interpreted including: CBC with white blood cell count minimally low at 3.9, hemoglobin minimally low at 11.7 otherwise unremarkable; CMP with normal electrolytes and kidney function, normal liver function testing; lipase normal; magnesium normal; UA without compelling signs of infection, suggest mild dehydration.  Reviewed pertinent lab work and imaging with patient at bedside. Questions answered.   Most current vital signs reviewed and are as follows: BP 103/69   Pulse 61   Temp 98.6 F (37 C) (Oral)   Resp 17   Ht 4\' 11"  (1.499 m)   Wt 57.7 kg   LMP 12/19/2015   SpO2 98%   BMI 25.69 kg/m   Plan: Discharge to home.   Prescriptions written for: Imodium, probiotic  Other home care instructions discussed: Bland diet, maintain good  hydration  ED return instructions discussed: The patient was urged to return to the Emergency Department immediately with worsening of current symptoms, worsening abdominal pain, persistent vomiting, blood noted in stools, fever, or any other concerns. The patient verbalized understanding.   Follow-up instructions discussed: Patient encouraged to follow-up with their PCP in 2 days.  Medical Decision Making Amount and/or Complexity of Data Reviewed Labs: ordered.  Risk OTC drugs. Prescription drug management.   For this patient's complaint of abdominal pain, the following conditions were considered on the differential diagnosis: gastritis/PUD, enteritis/duodenitis, appendicitis, cholelithiasis/cholecystitis, cholangitis, pancreatitis, ruptured viscus, colitis, diverticulitis, small/large bowel obstruction, proctitis, cystitis, pyelonephritis, ureteral colic, aortic dissection, aortic aneurysm. In women, ectopic pregnancy, pelvic inflammatory disease, ovarian cysts, and tubo-ovarian abscess were also considered. Atypical chest etiologies were also considered including ACS, PE, and pneumonia.  Would have like to send off stool studies today, however patient fortunately has not been having persistent diarrhea during her ED stay.  Treated with IV fluids.  Appears well.  No signs of peritonitis on exam.  The patient's vital signs, pertinent lab work and imaging were reviewed and interpreted as discussed in the ED course. Hospitalization was considered for further testing, treatments, or serial exams/observation. However as patient is well-appearing, has a stable exam, and reassuring studies today, I do not feel that they warrant admission at this time. This plan was discussed with the patient who verbalizes agreement and comfort with this plan and seems reliable and able to return to the Emergency Department with worsening or changing symptoms.           Final Clinical Impression(s) / ED Diagnoses Final diagnoses:  Diarrhea, unspecified type    Rx / DC Orders ED Discharge Orders          Ordered    loperamide (IMODIUM) 2 MG capsule        04/03/23 1431    lactobacillus (FLORANEX/LACTINEX) PACK  3 times daily with meals        04/03/23 1431              Renne Crigler, PA-C 04/03/23 1436    Arby Barrette, MD 04/12/23 [phone]

## 2023-05-06 ENCOUNTER — Encounter (HOSPITAL_BASED_OUTPATIENT_CLINIC_OR_DEPARTMENT_OTHER): Payer: Self-pay

## 2023-05-06 ENCOUNTER — Emergency Department (HOSPITAL_BASED_OUTPATIENT_CLINIC_OR_DEPARTMENT_OTHER)
Admission: EM | Admit: 2023-05-06 | Discharge: 2023-05-06 | Disposition: A | Discharge status: Home / Self Care | Payer: Commercial Managed Care - PPO | Attending: Emergency Medicine | Admitting: Emergency Medicine

## 2023-05-06 ENCOUNTER — Other Ambulatory Visit: Payer: Self-pay

## 2023-05-06 DIAGNOSIS — S0101XA Laceration without foreign body of scalp, initial encounter: Secondary | ICD-10-CM | POA: Diagnosis present

## 2023-05-06 DIAGNOSIS — Y93E1 Activity, personal bathing and showering: Secondary | ICD-10-CM | POA: Diagnosis not present

## 2023-05-06 DIAGNOSIS — Y92002 Bathroom of unspecified non-institutional (private) residence single-family (private) house as the place of occurrence of the external cause: Secondary | ICD-10-CM | POA: Insufficient documentation

## 2023-05-06 DIAGNOSIS — W182XXA Fall in (into) shower or empty bathtub, initial encounter: Secondary | ICD-10-CM | POA: Insufficient documentation

## 2023-05-06 DIAGNOSIS — S0990XA Unspecified injury of head, initial encounter: Secondary | ICD-10-CM

## 2023-05-06 DIAGNOSIS — W19XXXA Unspecified fall, initial encounter: Secondary | ICD-10-CM

## 2023-05-06 MED ORDER — LIDOCAINE-EPINEPHRINE (PF) 2 %-1:200000 IJ SOLN
10.0000 mL | Freq: Once | INTRAMUSCULAR | Status: AC
  Administered 2023-05-06: 10 mL
  Filled 2023-05-06: qty 20

## 2023-05-06 NOTE — ED Notes (Signed)
Laceration irrigated and cleaned with saline and gauze. MD at bedside for lac repair.

## 2023-05-06 NOTE — ED Notes (Signed)
Reviewed AVS with patient, patient expressed understanding of directions, denies further questions at this time. 

## 2023-05-06 NOTE — ED Triage Notes (Signed)
Pt states she slipped getting out of the shower, hit posterior head, states was bleeding PTA. Denies LOC or other injuries.

## 2023-05-06 NOTE — ED Provider Notes (Signed)
EMERGENCY DEPARTMENT AT [facility]  Provider Note  CSN: [id number] Arrival date & time: 05/06/23 0530  History Chief Complaint  Patient presents with   Head Injury    Cathy Kim is a 57 y.o. female with no significant PMH reports she slipped getting out of the shower this morning, falling backwards and hitting her head on a potted plant. No LOC. Has been having some headache since, but no dizziness, nausea or vomiting. No other reported injuries.    Home Medications Prior to Admission medications   Medication Sig Start Date End Date Taking? Authorizing Provider  Ascorbic Acid (VITAMIN C PO) Take 1 tablet by mouth daily.    [provider]  Cholecalciferol (VITAMIN D) 50 MCG (2000 UT) tablet Take 1 Units by mouth daily.    [provider]  Cyanocobalamin (B-12 PO) Take by mouth.    [provider]  lactobacillus (FLORANEX/LACTINEX) PACK Take 1 packet (1 g total) by mouth 3 (three) times daily with meals. 04/03/23   Renne Crigler, PA-C  loperamide (IMODIUM) 2 MG capsule Take 1 tablet p.o. as needed after every loose stool up to 8 times per day 04/03/23   Renne Crigler, PA-C  Menaquinone-7 (VITAMIN K2 PO) Take by mouth.    [provider]  Omega-3 Fatty Acids (OMEGA-3 FISH OIL PO) Take by mouth.    [provider]  Riboflavin (B2 PO) Take by mouth.    [provider]  VITAMIN E PO Take by mouth.    [provider]     Allergies    Patient has no known allergies.   Review of Systems   Review of Systems Please see HPI for pertinent positives and negatives  Physical Exam BP 128/84   Pulse 63   Temp 98.3 F (36.8 C)   Resp 18   Ht 4\' 9"  (1.448 m)   Wt 56.7 kg   LMP 12/19/2015   SpO2 98%   BMI 27.05 kg/m   Physical Exam Vitals and nursing note reviewed.  HENT:     Head: Normocephalic.     Comments: 2cm occipital scalp laceration    Nose: Nose normal.  Eyes:      Extraocular Movements: Extraocular movements intact.  Pulmonary:     Effort: Pulmonary effort is normal.  Musculoskeletal:        General: Normal range of motion.     Cervical back: Neck supple.  Skin:    Findings: No rash (on exposed skin).  Neurological:     General: No focal deficit present.     Mental Status: She is alert and oriented to person, place, and time.     Cranial Nerves: No cranial nerve deficit.     Sensory: No sensory deficit.     Motor: No weakness.     Gait: Gait normal.  Psychiatric:        Mood and Affect: Mood normal.     ED Results / Procedures / Treatments   EKG None  Procedures .Marland KitchenLaceration Repair  Date/Time: 05/06/2023 6:07 AM  Performed by: Pollyann Savoy, MD Authorized by: Pollyann Savoy, MD   Consent:    Consent obtained:  Verbal   Consent given by:  Patient Anesthesia:    Anesthesia method:  Local infiltration   Local anesthetic:  Lidocaine 2% WITH epi Laceration details:    Location:  Scalp   Scalp location:  Occipital   Length (cm):  2 Pre-procedure details:  Preparation:  Patient was prepped and draped in usual sterile fashion Treatment:    Area cleansed with:  Saline   Irrigation method:  Syringe Skin repair:    Repair method:  Staples   Number of staples:  2 Approximation:    Approximation:  Close Repair type:    Repair type:  Simple Post-procedure details:    Dressing:  Open (no dressing)   Procedure completion:  Tolerated well, no immediate complications   Medications Ordered in the ED Medications  lidocaine-EPINEPHrine (XYLOCAINE W/EPI) 2 %-1:200000 (PF) injection 10 mL (has no administration in time range)    Initial Impression and Plan  Patient here with mechanical fall and head injury without LOC. Only symptom now is residual headache. I discussed risks and benefits of CT imaging, low likelihood of a clinically significant finding. She will forego imaging now. Wound repaired as above. Head injury  precautions given. Wound instructions provided, staple removal in about a week.   ED Course       MDM Rules/Calculators/A&P Medical Decision Making Problems Addressed: Fall, initial encounter: acute illness or injury Injury of head, initial encounter: acute illness or injury Scalp laceration, initial encounter: acute illness or injury  Risk Prescription drug management.     Final Clinical Impression(s) / ED Diagnoses Final diagnoses:  Fall, initial encounter  Injury of head, initial encounter  Scalp laceration, initial encounter    Rx / DC Orders ED Discharge Orders     None        Pollyann Savoy, MD 05/06/23 [phone]

## 2023-05-10 ENCOUNTER — Ambulatory Visit: Payer: Commercial Managed Care - PPO | Admitting: Medical

## 2023-05-10 ENCOUNTER — Encounter: Payer: Self-pay | Admitting: Medical

## 2023-05-10 VITALS — BP 114/77 | HR 64 | Temp 99.3°F | Resp 16 | Wt 122.0 lb

## 2023-05-10 DIAGNOSIS — S0101XD Laceration without foreign body of scalp, subsequent encounter: Secondary | ICD-10-CM

## 2023-05-10 NOTE — Progress Notes (Signed)
Subjective:    Patient ID: Cathy Kim, female    DOB: 1965/12/24, 57 y.o.   MRN: [id number]  HPI  Pt in for follow up.  Pt had fall I shower on on Monday 23 rd. Hit her head. No loc. She states went to ED and placed staples. She states her to remove stables.    Pt states had some ha day of accident. No ha now. Only faint pain over staple sight. No dizziness or confusion. She returned to work on Wednesday.    Review of Systems  Constitutional:  Negative for chills, fatigue and fever.  Respiratory:  Negative for cough.   Cardiovascular:  Negative for chest pain and palpitations.  Gastrointestinal:  Negative for abdominal pain.  Musculoskeletal:  Negative for back pain, gait problem and joint swelling.  Neurological:  Negative for dizziness, speech difficulty, weakness and light-headedness.  Hematological:  Negative for adenopathy. Does not bruise/bleed easily.  Psychiatric/Behavioral:  Negative for behavioral problems, confusion and decreased concentration.     Past Medical History:  Diagnosis Date   Thyroid nodule    benign FNA per patient     Social History   Socioeconomic History   Marital status: Legally Separated    Spouse name: Ezella Kell   Number of children: 3   Years of education: Not on file   Highest education level: Not on file  Occupational History   Occupation: Community education officer    Comment: Psychologist, educational  Tobacco Use   Smoking status: Never   Smokeless tobacco: Never  Vaping Use   Vaping status: Never Used  Substance and Sexual Activity   Alcohol use: No    Alcohol/week: 0.0 standard drinks of alcohol   Drug use: No   Sexual activity: Not Currently    Partners: Male    Birth control/protection: Post-menopausal  Other Topics Concern   Not on file  Social History Narrative   Original from [place]   Married, 3 children (2000, 1996, 1995)   Social Determinants of Health   Financial Resource Strain: Low Risk  (09/19/2017)   Overall  Financial Resource Strain (CARDIA)    Difficulty of Paying Living Expenses: Not hard at all  Food Insecurity: No Food Insecurity (11/30/2022)   Hunger Vital Sign    Worried About Running Out of Food in the Last Year: Never true    Ran Out of Food in the Last Year: Never true  Transportation Needs: No Transportation Needs (11/30/2022)   PRAPARE - Administrator, Civil Service (Medical): No    Lack of Transportation (Non-Medical): No  Physical Activity: Unknown (09/19/2017)   Exercise Vital Sign    Days of Exercise per Week: 0 days    Minutes of Exercise per Session: Not on file  Stress: No Stress Concern Present (09/19/2017)   [employer] of Occupational Health - Occupational Stress Questionnaire    Feeling of Stress : Not at all  Social Connections: Somewhat Isolated (09/19/2017)   Social Connection and Isolation Panel [NHANES]    Frequency of Communication with Friends and Family: More than three times a week    Frequency of Social Gatherings with Friends and Family: Once a week    Attends Religious Services: Never    Database administrator or Organizations: No    Attends Banker Meetings: Never    Marital Status: Married  Catering manager Violence: Not At Risk (11/30/2022)   Humiliation, Afraid, Rape, and Kick questionnaire    Fear  of Current or Ex-Partner: No    Emotionally Abused: No    Physically Abused: No    Sexually Abused: No    Past Surgical History:  Procedure Laterality Date   OVARIAN CYST REMOVAL Left 1994   UPPER GASTROINTESTINAL ENDOSCOPY      Family History  Problem Relation Age of Onset   Osteoporosis Mother    GER disease Mother    Diabetes Father    Prostate cancer Father    Osteoarthritis Sister    Pancreatic cancer Maternal Uncle    Thyroid disease Paternal Aunt        Goiter    No Known Allergies  Current Outpatient Medications on File Prior to Visit  Medication Sig Dispense Refill   Ascorbic Acid (VITAMIN C PO) Take 1  tablet by mouth daily.     Cholecalciferol (VITAMIN D) 50 MCG (2000 UT) tablet Take 1 Units by mouth daily.     Cyanocobalamin (B-12 PO) Take by mouth.     lactobacillus (FLORANEX/LACTINEX) PACK Take 1 packet (1 g total) by mouth 3 (three) times daily with meals. 12 packet 1   loperamide (IMODIUM) 2 MG capsule Take 1 tablet p.o. as needed after every loose stool up to 8 times per day 20 capsule 0   Menaquinone-7 (VITAMIN K2 PO) Take by mouth.     Omega-3 Fatty Acids (OMEGA-3 FISH OIL PO) Take by mouth.     Riboflavin (B2 PO) Take by mouth.     VITAMIN E PO Take by mouth.     No current facility-administered medications on file prior to visit.    BP 114/77 (BP Location: Right Arm, Patient Position: Sitting, Cuff Size: Small)   Pulse 64   Temp 99.3 F (37.4 C) (Oral)   Resp 16   Wt 122 lb (55.3 kg)   LMP 12/19/2015   SpO2 100%   BMI 26.40 kg/m        Objective:   Physical Exam  General Mental Status- Alert. General Appearance- Not in acute distress.   Skin General: Color- Normal Color. Moisture- Normal Moisture.  Neck Carotid Arteries- Normal color. Moisture- Normal Moisture. No carotid bruits. No JVD.  Chest and Lung Exam Auscultation: Breath Sounds:-Normal.  Cardiovascular Auscultation:Rythm- Regular. Murmurs & Other Heart Sounds:Auscultation of the heart reveals- No Murmurs.  Abdomen Inspection:-Inspeection Normal. Palpation/Percussion:Note:No mass. Palpation and Percussion of the abdomen reveal- Non Tender, Non Distended + BS, no rebound or guarding.  Neurologic Cranial Nerve exam:- CN III-XII intact(No nystagmus), symmetric smile. Strength:- 5/5 equal and symmetric strength both upper and lower extremities.   Scalp- 2 cm laceration. 2 staples in place. Clean wound. Thick hair hard to see scalp fully but does not look fully healed.    Assessment & Plan:   Patient Instructions  Laceration of scalp, subsequent encounter -2 stable in place. Wound is clean.  Possible not healed completely. You express concern about staple not ready to be remove. Most of time 3-5 days adequate but in some cases not. -can hold off and on Monday can remove. When you bring fmla type paperwork. May have to wait a little bit but procedure is quick.  Follow up as regular scheduled with pcp.     Esperanza Richters, PA-C

## 2023-05-10 NOTE — Patient Instructions (Addendum)
Laceration of scalp, subsequent encounter -2 stable in place. Wound is clean. Possible not healed completely. You express concern about staple not ready to be remove. Most of time 3-5 days adequate but in some cases not. -can hold off and on Monday can remove. When you bring fmla type paperwork. May have to wait a little bit but procedure is quick.  Follow up as regular scheduled with pcp.

## 2023-05-13 ENCOUNTER — Ambulatory Visit: Payer: Commercial Managed Care - PPO | Admitting: Family Medicine

## 2023-05-13 ENCOUNTER — Encounter: Payer: Self-pay | Admitting: Family Medicine

## 2023-05-13 VITALS — BP 126/71 | HR 62 | Ht <= 58 in | Wt 122.2 lb

## 2023-05-13 DIAGNOSIS — S0101XS Laceration without foreign body of scalp, sequela: Secondary | ICD-10-CM | POA: Diagnosis not present

## 2023-05-13 DIAGNOSIS — S0101XD Laceration without foreign body of scalp, subsequent encounter: Secondary | ICD-10-CM

## 2023-05-13 NOTE — Patient Instructions (Signed)
There were 2 staples that were removed.  FMLA forms were filled out.  Let us know if you need anything.

## 2023-05-13 NOTE — Progress Notes (Signed)
No chief complaint on file.   Subjective: Patient is a 57 y.o. female here for f/u.  On 05/06/2023, the patient slipped in the shower and hit the back of her head.  She went to the emergency department and had 2 staples placed.  She missed that day and the following day recovering from her head injury.  She is doing well and is here for staple removal.  She needs a form filled out allowing for relief.  Past Medical History:  Diagnosis Date   Thyroid nodule    benign FNA per patient    Objective: BP 126/71   Pulse 62   Ht 4\' 9"  (1.448 m)   Wt 122 lb 3.2 oz (55.4 kg)   LMP 12/19/2015   SpO2 100%   BMI 26.44 kg/m  General: Awake, appears stated age Skin: Excoriated laceration with 2 staples present on the superior portion of the occiput.  There is no surrounding erythema, fluctuance, drainage or excessive warmth. Lungs: No accessory muscle use Psych: Age appropriate judgment and insight, normal affect and mood  Assessment and Plan: Laceration of scalp, sequela  2 staples removed.  Slight bleeding from scab removal.  The area looks overall clean and dry.  FMLA form filled out.  It was faxed and a copy was provided to the patient.  Follow-up with regular PCP as originally scheduled. The patient voiced understanding and agreement to the plan.  I spent 20 min w the pt discussing the above plan in addition to removing her staples and reviewing her chart on the same day of the visit.   Jilda Roche [name], DO 05/13/23  5:10 PM

## 2023-06-21 ENCOUNTER — Ambulatory Visit (INDEPENDENT_AMBULATORY_CARE_PROVIDER_SITE_OTHER): Payer: Commercial Managed Care - PPO | Admitting: Internal Medicine

## 2023-06-21 ENCOUNTER — Encounter: Payer: Self-pay | Admitting: Internal Medicine

## 2023-06-21 VITALS — BP 108/72 | HR 74 | Temp 98.2°F | Resp 16 | Ht <= 58 in | Wt 121.0 lb

## 2023-06-21 DIAGNOSIS — Z780 Asymptomatic menopausal state: Secondary | ICD-10-CM

## 2023-06-21 DIAGNOSIS — Z1231 Encounter for screening mammogram for malignant neoplasm of breast: Secondary | ICD-10-CM

## 2023-06-21 DIAGNOSIS — Z0001 Encounter for general adult medical examination with abnormal findings: Secondary | ICD-10-CM

## 2023-06-21 DIAGNOSIS — Z114 Encounter for screening for human immunodeficiency virus [HIV]: Secondary | ICD-10-CM

## 2023-06-21 DIAGNOSIS — E559 Vitamin D deficiency, unspecified: Secondary | ICD-10-CM | POA: Diagnosis not present

## 2023-06-21 DIAGNOSIS — M8589 Other specified disorders of bone density and structure, multiple sites: Secondary | ICD-10-CM | POA: Diagnosis not present

## 2023-06-21 DIAGNOSIS — Z1159 Encounter for screening for other viral diseases: Secondary | ICD-10-CM | POA: Diagnosis not present

## 2023-06-21 DIAGNOSIS — Z0000 Encounter for general adult medical examination without abnormal findings: Secondary | ICD-10-CM | POA: Diagnosis not present

## 2023-06-21 DIAGNOSIS — F32A Depression, unspecified: Secondary | ICD-10-CM

## 2023-06-21 DIAGNOSIS — Z23 Encounter for immunization: Secondary | ICD-10-CM

## 2023-06-21 DIAGNOSIS — F419 Anxiety disorder, unspecified: Secondary | ICD-10-CM | POA: Diagnosis not present

## 2023-06-21 DIAGNOSIS — Z8639 Personal history of other endocrine, nutritional and metabolic disease: Secondary | ICD-10-CM | POA: Diagnosis not present

## 2023-06-21 NOTE — Patient Instructions (Addendum)
Vaccines I recommend:  Covid booster Flu shot      GO TO THE LAB : Get the blood work     Next visit with me in 4 months    Please schedule it at the front desk    STOP BY THE FIRST FLOOR: Arrange for your bone density test

## 2023-06-21 NOTE — Progress Notes (Unsigned)
Subjective:    Patient ID: Cathy Kim, female    DOB: 18-Oct-1965, 57 y.o.   MRN: [id number]  DOS:  06/21/2023 Type of visit - description: CPX  Here for CPX Going through divorce, reports some stress and anxiety. 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 Denies GI symptoms. Wt Readings from Last 3 Encounters:  06/21/23 121 lb (54.9 kg)  05/13/23 122 lb 3.2 oz (55.4 kg)  05/10/23 122 lb (55.3 kg)     Review of Systems See above   Past Medical History:  Diagnosis Date   Thyroid nodule    benign FNA per patient    Past Surgical History:  Procedure Laterality Date   OVARIAN CYST REMOVAL Left 1994   UPPER GASTROINTESTINAL ENDOSCOPY      Current Outpatient Medications  Medication Instructions   Ascorbic Acid (VITAMIN C PO) 1 tablet, Oral, Daily   Cyanocobalamin (B-12 PO) Oral   lactobacillus (FLORANEX/LACTINEX) PACK 1 g, Oral, 3 times daily with meals   loperamide (IMODIUM) 2 MG capsule Take 1 tablet p.o. as needed after every loose stool up to 8 times per day   Menaquinone-7 (VITAMIN K2 PO) Oral   Omega-3 Fatty Acids (OMEGA-3 FISH OIL PO) Oral   Riboflavin (B2 PO) Oral   Vitamin D 1 Units, Oral, Daily   VITAMIN E PO Oral       Objective:   Physical Exam BP 108/72   Pulse 74   Temp 98.2 F (36.8 C) (Oral)   Resp 16   Ht 4\' 9"  (1.448 m)   Wt 121 lb (54.9 kg)   LMP 12/19/2015   SpO2 98%   BMI 26.18 kg/m  General: Well developed, NAD, BMI noted Neck: No  thyromegaly  HEENT:  Normocephalic . Face symmetric, atraumatic Lungs:  CTA B Normal respiratory effort, no intercostal retractions, no accessory muscle use. Heart: RRR,  no murmur.  Abdomen:  Not distended, soft, non-tender. No rebound or rigidity.   Lower extremities: no pretibial edema bilaterally  Skin: Exposed areas without rash. Not pale. Not jaundice Neurologic:  alert & oriented X3.  Speech normal, gait appropriate for age and unassisted Strength symmetric and appropriate for  age.  Psych: Cognition and judgment appear intact.  Cooperative with normal attention span and concentration.  Behavior appropriate. Mild anxiety noted, no depression.     Assessment     ASSESSMENT   (new pt 09-2017)  Dysphagia: Saw GI 08-2017, Rx EGD.  EGD 3-25: Normal, dilated. 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 Menopausal 2018 Palpable aorta on exam 12/2017 Vitamin D deficiency   PLAN Here for CPX - Td today  -covid vax booster recommended - flu shot: declined today  - female care, per gyn, PAP and MMG 04/2022 -CCS: Cscope 05/2019 no polyps, 10 years  -Labs: CMP FLP CBC HIV hep C.  Vitamin D.  Also request B12 and folic acid -Diet and exercise emphasized. Osteopenia :  T score  -2.2 (01/2021) , on vit D.    FH also osteoporosis on her mother late in life?  She never had a Fx.  No personal h/o Fx.  Plan: Will check a DEXA. Thyroid nodules: Due to see Endo.  Referral. Tinnitus: Stable.  Recommend good hearing protection. Stress, anxiety: Related to her divorce, strongly encouraged to see a counselor, information provided.  Also increase physical activity.  Medications are options, RTC in 4 months. RTC 4 months  Multiple issues. 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  Request further evaluation.  Refer to Ortho. Baker's cyst: Swelling behind the left knee likely related to a Baker's cyst.  Recommend to discuss w/ ortho and confitm the dx. Don't  suspect DVT. Tinnitus: Without HOH, not disrupting her activities, recommend observation. Lower abdominal pain: Bilateral lower abdominal pain as described above,  ROS benign, saw gynecology 05/04/2022, had a brief discussion with the gynecologist about the issue and apparently GYN did not feel this was female related. Recommend a UA, urine culture, CBC CMP and reassess in 3 months. Patient is very concerned about the issue and request further evaluation.  Will proceed with CT abdomen and pelvis with lab results. RTC for CPX in 3 months

## 2023-06-21 NOTE — Addendum Note (Signed)
Addended byConrad Baraga D on: 06/21/2023 02:11 PM   Modules accepted: Orders

## 2023-06-22 ENCOUNTER — Encounter: Payer: Self-pay | Admitting: Internal Medicine

## 2023-06-22 DIAGNOSIS — M8580 Other specified disorders of bone density and structure, unspecified site: Secondary | ICD-10-CM | POA: Insufficient documentation

## 2023-06-22 LAB — VITAMIN D 25 HYDROXY (VIT D DEFICIENCY, FRACTURES): Vit D, 25-Hydroxy: 30 ng/mL (ref 30–100)

## 2023-06-22 LAB — LIPID PANEL
Cholesterol: 180 mg/dL (ref <200)
HDL: 70 mg/dL (ref >=50)
LDL Cholesterol (Calc): 87 mg/dL
Non-HDL Cholesterol (Calc): 110 mg/dL (ref <130)
Total CHOL/HDL Ratio: 2.6 (calc) (ref <5.0)
Triglycerides: 133 mg/dL (ref <150)

## 2023-06-22 LAB — COMPREHENSIVE METABOLIC PANEL
AG Ratio: 1.8 (calc) (ref 1.0–2.5)
ALT: 12 U/L (ref 6–29)
AST: 17 U/L (ref 10–35)
Albumin: 4.4 g/dL (ref 3.6–5.1)
Alkaline phosphatase (APISO): 91 U/L (ref 37–153)
BUN: 16 mg/dL (ref 7–25)
CO2: 27 mmol/L (ref 20–32)
Calcium: 9.8 mg/dL (ref 8.6–10.4)
Chloride: 103 mmol/L (ref 98–110)
Creat: 0.75 mg/dL (ref 0.50–1.03)
Globulin: 2.5 g/dL (ref 1.9–3.7)
Glucose, Bld: 81 mg/dL (ref 65–99)
Potassium: 5 mmol/L (ref 3.5–5.3)
Sodium: 139 mmol/L (ref 135–146)
Total Bilirubin: 0.4 mg/dL (ref 0.2–1.2)
Total Protein: 6.9 g/dL (ref 6.1–8.1)

## 2023-06-22 LAB — CBC WITH DIFFERENTIAL/PLATELET
Absolute Lymphocytes: 2379 {cells}/uL (ref 850–3900)
Absolute Monocytes: 372 {cells}/uL (ref 200–950)
Basophils Absolute: 18 {cells}/uL (ref 0–200)
Basophils Relative: 0.3 %
Eosinophils Absolute: 61 {cells}/uL (ref 15–500)
Eosinophils Relative: 1 %
HCT: 40.5 % (ref 35.0–45.0)
Hemoglobin: 12.7 g/dL (ref 11.7–15.5)
MCH: 27.7 pg (ref 27.0–33.0)
MCHC: 31.4 g/dL — ABNORMAL LOW (ref 32.0–36.0)
MCV: 88.2 fL (ref 80.0–100.0)
MPV: 11.8 fL (ref 7.5–12.5)
Monocytes Relative: 6.1 %
Neutro Abs: 3270 {cells}/uL (ref 1500–7800)
Neutrophils Relative %: 53.6 %
Platelets: 271 10*3/uL (ref 140–400)
RBC: 4.59 10*6/uL (ref 3.80–5.10)
RDW: 16.7 % — ABNORMAL HIGH (ref 11.0–15.0)
Total Lymphocyte: 39 %
WBC: 6.1 10*3/uL (ref 3.8–10.8)

## 2023-06-22 LAB — HEPATITIS C ANTIBODY: Hepatitis C Ab: NONREACTIVE

## 2023-06-22 LAB — B12 AND FOLATE PANEL
Folate: 18.7 ng/mL
Vitamin B-12: >2000 pg/mL — ABNORMAL HIGH (ref 200–1100)

## 2023-06-22 LAB — HIV ANTIBODY (ROUTINE TESTING W REFLEX): HIV 1&2 Ab, 4th Generation: NONREACTIVE

## 2023-06-22 NOTE — Assessment & Plan Note (Signed)
Here for CPX - Td 06/2023  -covid vax booster recommended - flu shot: declined today  - female care, per gyn, PAP and MMG 04/2022 -CCS: Cscope 05/2019 no polyps, 10 years  -Labs: CMP FLP CBC HIV hep C.  Vitamin D.  Also request B12 and folic acid -Diet and exercise emphasized.

## 2023-06-22 NOTE — Assessment & Plan Note (Signed)
Here for CPX Osteopenia :  T score  -2.2 (01/2021) , on vit D.    FH osteoporosis on her mother, late in life, she never had a Fx.  No personal h/o Fx.  Plan: DEXA. Thyroid nodules: Due to see Endo.  Referral. Tinnitus: Stable.  Recommend good hearing protection. Stress, anxiety: Related to her divorce, strongly encouraged to see a counselor, information provided.  Also increase physical activity.  Medication is a option, RTC in 4 months. RTC 4 months

## 2023-06-24 ENCOUNTER — Telehealth: Payer: Self-pay

## 2023-06-24 NOTE — Telephone Encounter (Signed)
Form completed and faxed back to [employer] at [phone]. Copy emailed to Pt at [email or web address]. Copy of form sent for scanning.

## 2023-06-25 ENCOUNTER — Encounter: Payer: Self-pay | Admitting: Internal Medicine

## 2023-07-02 ENCOUNTER — Other Ambulatory Visit (HOSPITAL_BASED_OUTPATIENT_CLINIC_OR_DEPARTMENT_OTHER): Payer: Self-pay | Admitting: Internal Medicine

## 2023-07-02 DIAGNOSIS — Z0001 Encounter for general adult medical examination with abnormal findings: Secondary | ICD-10-CM

## 2023-07-02 DIAGNOSIS — Z0000 Encounter for general adult medical examination without abnormal findings: Secondary | ICD-10-CM

## 2023-07-02 DIAGNOSIS — F32A Depression, unspecified: Secondary | ICD-10-CM

## 2023-07-02 DIAGNOSIS — E559 Vitamin D deficiency, unspecified: Secondary | ICD-10-CM

## 2023-07-02 DIAGNOSIS — Z780 Asymptomatic menopausal state: Secondary | ICD-10-CM

## 2023-07-02 DIAGNOSIS — Z1231 Encounter for screening mammogram for malignant neoplasm of breast: Secondary | ICD-10-CM

## 2023-07-02 DIAGNOSIS — Z8639 Personal history of other endocrine, nutritional and metabolic disease: Secondary | ICD-10-CM

## 2023-07-02 DIAGNOSIS — Z114 Encounter for screening for human immunodeficiency virus [HIV]: Secondary | ICD-10-CM

## 2023-07-02 DIAGNOSIS — M8589 Other specified disorders of bone density and structure, multiple sites: Secondary | ICD-10-CM

## 2023-07-02 DIAGNOSIS — Z23 Encounter for immunization: Secondary | ICD-10-CM

## 2023-07-02 DIAGNOSIS — Z1159 Encounter for screening for other viral diseases: Secondary | ICD-10-CM

## 2023-07-18 ENCOUNTER — Ambulatory Visit (HOSPITAL_BASED_OUTPATIENT_CLINIC_OR_DEPARTMENT_OTHER)
Admission: RE | Admit: 2023-07-18 | Discharge: 2023-07-18 | Disposition: A | Discharge status: Home / Self Care | Payer: Commercial Managed Care - PPO | Source: Ambulatory Visit | Attending: Internal Medicine | Admitting: Internal Medicine

## 2023-07-18 DIAGNOSIS — Z780 Asymptomatic menopausal state: Secondary | ICD-10-CM | POA: Diagnosis present

## 2023-07-18 DIAGNOSIS — E559 Vitamin D deficiency, unspecified: Secondary | ICD-10-CM | POA: Diagnosis not present

## 2023-07-18 DIAGNOSIS — M8589 Other specified disorders of bone density and structure, multiple sites: Secondary | ICD-10-CM | POA: Diagnosis not present

## 2023-07-18 DIAGNOSIS — Z8262 Family history of osteoporosis: Secondary | ICD-10-CM | POA: Diagnosis not present

## 2023-07-18 DIAGNOSIS — Z13820 Encounter for screening for osteoporosis: Secondary | ICD-10-CM | POA: Diagnosis present

## 2023-07-23 ENCOUNTER — Telehealth: Payer: Self-pay | Admitting: Internal Medicine

## 2023-07-23 NOTE — Telephone Encounter (Signed)
**  Spanish Interpreter was utilized during this callBorgWarner ID: [phone]  Pt called stating that she needed to have a copy of her records made available for pickup regarding her follow ups on her fall she went to the ED for. Pt stated that she needed to file this with her accident insurance she has through her employer by 12.12.24. Advised pt that the info from these visits could be pulled through MyChart to be sent. Pt acknowledged understanding but wanted to have a copy just in case. Advised pt a note would be sent back to request physical copies to be made available for pickup.

## 2023-07-23 NOTE — Telephone Encounter (Signed)
ED records must come from the ER or medical record department.

## 2023-07-24 NOTE — Telephone Encounter (Signed)
LMOM informing Pt that OV notes from 05/10/23 and 05/13/23 are printed and ready to pick up at front desk.

## 2023-08-30 ENCOUNTER — Ambulatory Visit: Payer: Commercial Managed Care - PPO | Admitting: Radiology

## 2023-11-15 ENCOUNTER — Ambulatory Visit: Payer: Commercial Managed Care - PPO | Admitting: Internal Medicine

## 2023-11-15 ENCOUNTER — Encounter: Payer: Self-pay | Admitting: Internal Medicine

## 2024-05-23 ENCOUNTER — Other Ambulatory Visit: Payer: Self-pay

## 2024-05-23 ENCOUNTER — Emergency Department (HOSPITAL_BASED_OUTPATIENT_CLINIC_OR_DEPARTMENT_OTHER)

## 2024-05-23 ENCOUNTER — Emergency Department (HOSPITAL_BASED_OUTPATIENT_CLINIC_OR_DEPARTMENT_OTHER)
Admission: EM | Admit: 2024-05-23 | Discharge: 2024-05-23 | Disposition: A | Discharge status: Home / Self Care | Attending: Emergency Medicine | Admitting: Emergency Medicine

## 2024-05-23 ENCOUNTER — Emergency Department (HOSPITAL_BASED_OUTPATIENT_CLINIC_OR_DEPARTMENT_OTHER): Admitting: Radiology

## 2024-05-23 ENCOUNTER — Encounter (HOSPITAL_BASED_OUTPATIENT_CLINIC_OR_DEPARTMENT_OTHER): Payer: Self-pay | Admitting: Emergency Medicine

## 2024-05-23 DIAGNOSIS — R42 Dizziness and giddiness: Secondary | ICD-10-CM | POA: Diagnosis not present

## 2024-05-23 DIAGNOSIS — R079 Chest pain, unspecified: Secondary | ICD-10-CM | POA: Insufficient documentation

## 2024-05-23 LAB — URINALYSIS, ROUTINE W REFLEX MICROSCOPIC
Bilirubin Urine: NEGATIVE
Glucose, UA: NEGATIVE mg/dL
Hgb urine dipstick: NEGATIVE
Ketones, ur: NEGATIVE mg/dL
Leukocytes,Ua: NEGATIVE
Nitrite: NEGATIVE
Protein, ur: NEGATIVE mg/dL
Specific Gravity, Urine: 1.011 (ref 1.005–1.030)
pH: 8 (ref 5.0–8.0)

## 2024-05-23 LAB — COMPREHENSIVE METABOLIC PANEL WITH GFR
ALT: 16 U/L (ref 0–44)
AST: 21 U/L (ref 15–41)
Albumin: 4.5 g/dL (ref 3.5–5.0)
Alkaline Phosphatase: 98 U/L (ref 38–126)
Anion gap: 10 (ref 5–15)
BUN: 12 mg/dL (ref 6–20)
CO2: 28 mmol/L (ref 22–32)
Calcium: 10.4 mg/dL — ABNORMAL HIGH (ref 8.9–10.3)
Chloride: 103 mmol/L (ref 98–111)
Creatinine, Ser: 0.68 mg/dL (ref 0.44–1.00)
GFR, Estimated: >60 mL/min (ref >60)
Glucose, Bld: 92 mg/dL (ref 70–99)
Potassium: 4.2 mmol/L (ref 3.5–5.1)
Sodium: 141 mmol/L (ref 135–145)
Total Bilirubin: 0.5 mg/dL (ref 0.0–1.2)
Total Protein: 7.3 g/dL (ref 6.5–8.1)

## 2024-05-23 LAB — CBC
HCT: 39.3 % (ref 36.0–46.0)
Hemoglobin: 12.6 g/dL (ref 12.0–15.0)
MCH: 27.7 pg (ref 26.0–34.0)
MCHC: 32.1 g/dL (ref 30.0–36.0)
MCV: 86.4 fL (ref 80.0–100.0)
Platelets: 215 K/uL (ref 150–400)
RBC: 4.55 MIL/uL (ref 3.87–5.11)
RDW: 17.2 % — ABNORMAL HIGH (ref 11.5–15.5)
WBC: 5.4 K/uL (ref 4.0–10.5)
nRBC: 0 % (ref 0.0–0.2)

## 2024-05-23 LAB — RESP PANEL BY RT-PCR (RSV, FLU A&B, COVID)  RVPGX2
Influenza A by PCR: NEGATIVE
Influenza B by PCR: NEGATIVE
Resp Syncytial Virus by PCR: NEGATIVE
SARS Coronavirus 2 by RT PCR: NEGATIVE

## 2024-05-23 LAB — TROPONIN T, HIGH SENSITIVITY: Troponin T High Sensitivity: <15 ng/L (ref 0–19)

## 2024-05-23 LAB — TSH: TSH: 0.67 u[IU]/mL (ref 0.350–4.500)

## 2024-05-23 MED ORDER — IOHEXOL 350 MG/ML SOLN
100.0000 mL | Freq: Once | INTRAVENOUS | Status: AC | PRN
  Administered 2024-05-23: 100 mL via INTRAVENOUS

## 2024-05-23 NOTE — ED Triage Notes (Signed)
 C/o CP and dizziness x 1 week. Denies SHOB.

## 2024-05-23 NOTE — ED Provider Notes (Signed)
  Physical Exam  BP 127/86   Pulse 60   Temp 98.5 F (36.9 C) (Oral)   Resp 12   LMP 12/19/2015   SpO2 99%   Physical Exam Vitals and nursing note reviewed.  HENT:     Head: Normocephalic and atraumatic.  Eyes:     Pupils: Pupils are equal, round, and reactive to light.  Cardiovascular:     Rate and Rhythm: Normal rate and regular rhythm.  Pulmonary:     Effort: Pulmonary effort is normal.     Breath sounds: Normal breath sounds.  Abdominal:     Palpations: Abdomen is soft.     Tenderness: There is no abdominal tenderness.  Skin:    General: Skin is warm and dry.  Neurological:     Mental Status: She is alert.  Psychiatric:        Mood and Affect: Mood normal.     Procedures  Procedures  ED Course / MDM   Clinical Course as of 05/23/24 1549  Sat May 23, 2024  1546 CTA unremarkable.  Patient has remained stable.  Unclear cause of her constellation of symptoms including headache dizziness chest pain shortness of breath general malaise.  Laboratory workup unremarkable.  UA negative.  High since troponin initially less than 15.  No need for delta given chest pain for 1 week.  Will send viral swab and check for close PCP follow-up. [MP]    Clinical Course User Index [MP] Pamella Ozell LABOR, DO   Medical Decision Making I, Ozell Pamella DO, have assumed care of this patient from the previous provider pending CTA head and neck, reevaluation and disposition  Amount and/or Complexity of Data Reviewed Labs: ordered. Radiology: ordered.  Risk Prescription drug management.          Pamella Ozell LABOR, DO 05/23/24 1549

## 2024-05-23 NOTE — ED Provider Notes (Signed)
 [place] EMERGENCY DEPARTMENT AT [facility] Provider Note   CSN: [id number] Arrival date & time: 05/23/24  1315     Patient presents with: Chest Pain   [facility] Cathy Kim is a 58 y.o. female.  She is here with 1 week of multiple new symptoms.  She feels dizzy and lightheaded, sometimes has some flashing lights in her right eye.  She has also had on and off chest pain and shortness of breath.  Nausea but no vomiting.  Sometimes has some tingling in her fingertips.  She has been constipated.  Pain in the back of her head.  No fevers or chills.  No urinary symptoms.  No trauma.  No recent illness no medication changes.   The history is provided by the patient.  Chest Pain Pain location:  Substernal area Pain quality: aching   Pain severity:  Moderate Onset quality:  Gradual Duration:  1 week Timing:  Intermittent Progression:  Unchanged Chronicity:  New Relieved by:  None tried Worsened by:  Nothing Ineffective treatments:  None tried Associated symptoms: dizziness, fatigue, nausea, near-syncope, numbness and shortness of breath   Associated symptoms: no abdominal pain, no cough, no diaphoresis, no fever, no syncope and no vomiting   Risk factors: no smoking        Prior to Admission medications   Medication Sig Start Date End Date Taking? Authorizing Provider  Ascorbic Acid (VITAMIN C PO) Take 1 tablet by mouth daily.    [provider]  Cholecalciferol (VITAMIN D ) 50 MCG (2000 UT) tablet Take 1 Units by mouth daily.    [provider]  Cyanocobalamin  (B-12 PO) Take by mouth.    [provider]  lactobacillus (FLORANEX/LACTINEX) PACK Take 1 packet (1 g total) by mouth 3 (three) times daily with meals. 04/03/23   Desiderio Chew, PA-C  Menaquinone-7 (VITAMIN K2 PO) Take by mouth.    [provider]  Omega-3 Fatty Acids (OMEGA-3 FISH OIL PO) Take by mouth.    [provider]  Riboflavin (B2 PO) Take by mouth.     [provider]  VITAMIN E PO Take by mouth.    [provider]    Allergies: Patient has no known allergies.    Review of Systems  Constitutional:  Positive for fatigue. Negative for diaphoresis and fever.  Eyes:  Positive for visual disturbance.  Respiratory:  Positive for shortness of breath. Negative for cough.   Cardiovascular:  Positive for chest pain and near-syncope. Negative for syncope.  Gastrointestinal:  Positive for nausea. Negative for abdominal pain and vomiting.  Genitourinary:  Negative for dysuria.  Skin:  Negative for rash.  Neurological:  Positive for dizziness and numbness.    Updated Vital Signs BP (!) 144/93 (BP Location: Right Arm)   Pulse 78   Temp 98.5 F (36.9 C) (Oral)   Resp 18   LMP 12/19/2015   SpO2 100%   Physical Exam Vitals and nursing note reviewed.  Constitutional:      General: She is not in acute distress.    Appearance: Normal appearance. She is well-developed.  HENT:     Head: Normocephalic and atraumatic.  Eyes:     Conjunctiva/sclera: Conjunctivae normal.  Cardiovascular:     Rate and Rhythm: Normal rate and regular rhythm.     Heart sounds: Normal heart sounds. No murmur heard. Pulmonary:     Effort: Pulmonary effort is normal. No respiratory distress.     Breath sounds: Normal breath sounds.  Abdominal:  Palpations: Abdomen is soft.     Tenderness: There is no abdominal tenderness.  Musculoskeletal:        General: No swelling.     Cervical back: Neck supple.     Right lower leg: No tenderness. No edema.     Left lower leg: No tenderness. No edema.  Skin:    General: Skin is warm and dry.     Capillary Refill: Capillary refill takes less than 2 seconds.  Neurological:     General: No focal deficit present.     Mental Status: She is alert.     (all labs ordered are listed, but only abnormal results are displayed) Labs Reviewed  CBC - Abnormal; Notable for the following components:      Result  Value   RDW 17.2 (*)    All other components within normal limits  URINALYSIS, ROUTINE W REFLEX MICROSCOPIC - Abnormal; Notable for the following components:   Color, Urine COLORLESS (*)    All other components within normal limits  COMPREHENSIVE METABOLIC PANEL WITH GFR - Abnormal; Notable for the following components:   Calcium 10.4 (*)    All other components within normal limits  RESP PANEL BY RT-PCR (RSV, FLU A&B, COVID)  [id number]  TSH  TROPONIN T, HIGH SENSITIVITY    EKG: EKG Interpretation Date/Time:  Saturday May 23 2024 13:26:36 EDT Ventricular Rate:  64 PR Interval:  150 QRS Duration:  102 QT Interval:  396 QTC Calculation: 409 R Axis:   -20  Text Interpretation: Sinus rhythm Borderline left axis deviation Low voltage, precordial leads Borderline ST depression, lateral leads Baseline wander in lead(s) V5 No old tracing to compare Confirmed by Towana Sharper [id number]) on 05/23/2024 1:35:20 PM  Radiology: CT ANGIO HEAD NECK W WO CM Result Date: 05/23/2024 EXAM: CT HEAD WITHOUT CTA HEAD AND NECK WITH AND WITHOUT 05/23/2024 02:52:00 PM TECHNIQUE: CTA of the head and neck was performed with and without the administration of intravenous contrast (100 mL iohexol (OMNIPAQUE) 350 MG/ML injection). Noncontrast CT of the head with reconstructed 2-D images are also provided for review. Multiplanar 2D and/or 3D reformatted images are provided for review. Automated exposure control, iterative reconstruction, and/or weight based adjustment of the mA/kV was utilized to reduce the radiation dose to as low as reasonably achievable. COMPARISON: None available CLINICAL HISTORY: headache, neck pain. Per patient headache x 10 days and once or twice a month feels like someone is pulling her hair on her neck and it is very painful. FINDINGS: CT HEAD: BRAIN AND VENTRICLES: No acute intracranial hemorrhage. No mass effect or midline shift. No extra-axial fluid collection. Gray-white differentiation is  maintained. No hydrocephalus. ORBITS: No acute abnormality. SINUSES: No acute abnormality. SOFT TISSUES AND SKULL: No acute abnormality. CTA NECK: AORTIC ARCH AND ARCH VESSELS: A common origin of the innominate and left common carotid artery is noted. No dissection or arterial injury. No significant stenosis of the brachiocephalic or subclavian arteries. CERVICAL CAROTID ARTERIES: No dissection, arterial injury, or hemodynamically significant stenosis by NASCET criteria. CERVICAL VERTEBRAL ARTERIES: No dissection, arterial injury, or significant stenosis. VISUALIZED LUNGS AND MEDIASTINUM: Unremarkable. SOFT TISSUES: No acute abnormality. BONES: Mild degenerative changes are present in the lower cervical spine. Straightening of the normal cervical lordosis is present. Mild rightward curvature is present inferiorly. CTA HEAD: ANTERIOR CIRCULATION: No significant stenosis of the internal carotid arteries. No significant stenosis of the anterior cerebral arteries. No significant stenosis of the middle cerebral arteries. No aneurysm. POSTERIOR CIRCULATION: No significant stenosis  of the posterior cerebral arteries. No significant stenosis of the basilar artery. No significant stenosis of the vertebral arteries. No aneurysm. OTHER: No dural venous sinus thrombosis on this non-dedicated study. IMPRESSION: 1. No acute intracranial abnormality. 2. No large vessel occlusion, hemodynamically significant stenosis, or aneurysm in the head or neck. 3. Degenerative changes in the lower cervical spine could be related to the pulling sensation the patient describes. Electronically signed by: Lonni Necessary MD 05/23/2024 03:31 PM EDT RP Workstation: [id number]   DG Chest 2 View Result Date: 05/23/2024 CLINICAL DATA:  Chest pain EXAM: CHEST - 2 VIEW COMPARISON:  Chest radiograph dated 03/01/2020 FINDINGS: Normal lung volumes. No focal consolidations. No pleural effusion or pneumothorax. The heart size and mediastinal contours  are within normal limits. No acute osseous abnormality. IMPRESSION: No active cardiopulmonary disease. Electronically Signed   By: Limin  Xu M.D.   On: 05/23/2024 14:07     Procedures   Medications Ordered in the ED  iohexol (OMNIPAQUE) 350 MG/ML injection 100 mL (100 mLs Intravenous Contrast Given 05/23/24 1445)    Clinical Course as of 05/23/24 1658  Sat May 23, 2024  1546 CTA unremarkable.  Patient has remained stable.  Unclear cause of her constellation of symptoms including headache dizziness chest pain shortness of breath general malaise.  Laboratory workup unremarkable.  UA negative.  High since troponin initially less than 15.  No need for delta given chest pain for 1 week.  Will send viral swab and check for close PCP follow-up. [MP]    Clinical Course User Index [MP] Pamella Ozell LABOR, DO                                 Medical Decision Making Amount and/or Complexity of Data Reviewed Labs: ordered. Radiology: ordered.  Risk Prescription drug management.   This patient complains of multiple including posterior headache, dizziness, chest pain; this involves an extensive number of treatment Options and is a complaint that carries with it a high risk of complications and morbidity. The differential includes viral syndrome, dehydration, stroke, bleed, ACS, dehydration, metabolic derangement, infection  I ordered, reviewed and interpreted labs, which included CBC normal chemistries and LFTs normal troponin flat urinalysis negative TSH normal COVID and flu negative I ordered imaging studies which included chest x-ray and CT head and neck angio and I independently    visualized and interpreted imaging which showed no acute findings Previous records obtained and reviewed in epic including prior PCP and ED visits Cardiac monitoring reviewed, sinus rhythm Social determinants considered, no significant barriers Critical Interventions: None  After the interventions stated above,  I reevaluated the patient and found patient to be well-appearing and hemodynamically stable Admission and further testing considered, her care is signed out to Dr. Pamella to follow-up on final readings of CT.  Likely can be discharged and follow-up outpatient with PCP.      Final diagnoses:  Dizziness  Chest pain, unspecified type    ED Discharge Orders     None          Towana Ozell BROCKS, MD 05/23/24 1700

## 2024-05-23 NOTE — ED Notes (Addendum)
 MD at bedside. Patient informed Interpreter is at bedside but she verbalized feeling comfortable with english at this time.

## 2024-05-23 NOTE — Discharge Instructions (Signed)
 You were seen in the emergency room for dizziness and chest pain Your blood work EKG x-ray and CAT scan of your head and neck did not show any explanation for your symptoms There is no evidence of heart attack here today Is important that you follow-up with Dr. Amon to discuss today's visit within the next week Return to the emergency department for severe chest pain trouble breathing or any other concerns
